# Patient Record
Sex: Female | Born: 1970 | Race: White | Hispanic: Yes | Marital: Married | Smoking: Never smoker
Health system: Southern US, Community
[De-identification: ages and names within clinical notes are randomized; demographics above are authoritative.]

## PROBLEM LIST (undated history)

## (undated) HISTORY — PX: TUBAL LIGATION: SHX77

---

## 2000-11-15 ENCOUNTER — Ambulatory Visit (HOSPITAL_COMMUNITY): Admission: RE | Admit: 2000-11-15 | Discharge: 2000-11-15 | Payer: Self-pay | Admitting: *Deleted

## 2000-11-29 ENCOUNTER — Encounter: Admission: RE | Admit: 2000-11-29 | Discharge: 2000-11-29 | Payer: Self-pay | Admitting: Obstetrics & Gynecology

## 2000-12-06 ENCOUNTER — Ambulatory Visit (HOSPITAL_COMMUNITY): Admission: RE | Admit: 2000-12-06 | Discharge: 2000-12-06 | Payer: Self-pay | Admitting: Obstetrics & Gynecology

## 2000-12-06 ENCOUNTER — Encounter: Admission: RE | Admit: 2000-12-06 | Discharge: 2000-12-06 | Payer: Self-pay | Admitting: Obstetrics & Gynecology

## 2000-12-13 ENCOUNTER — Encounter: Admission: RE | Admit: 2000-12-13 | Discharge: 2000-12-13 | Payer: Self-pay | Admitting: Obstetrics & Gynecology

## 2000-12-20 ENCOUNTER — Encounter: Admission: RE | Admit: 2000-12-20 | Discharge: 2000-12-20 | Payer: Self-pay | Admitting: Obstetrics & Gynecology

## 2000-12-22 ENCOUNTER — Encounter (HOSPITAL_COMMUNITY): Admission: AD | Admit: 2000-12-22 | Discharge: 2000-12-27 | Payer: Self-pay | Admitting: *Deleted

## 2000-12-27 ENCOUNTER — Encounter: Admission: RE | Admit: 2000-12-27 | Discharge: 2000-12-27 | Payer: Self-pay | Admitting: Obstetrics & Gynecology

## 2000-12-28 ENCOUNTER — Inpatient Hospital Stay (HOSPITAL_COMMUNITY): Admission: AD | Admit: 2000-12-28 | Discharge: 2000-12-31 | Payer: Self-pay | Admitting: Obstetrics & Gynecology

## 2002-10-15 ENCOUNTER — Ambulatory Visit (HOSPITAL_COMMUNITY): Admission: RE | Admit: 2002-10-15 | Discharge: 2002-10-15 | Payer: Self-pay | Admitting: *Deleted

## 2003-02-07 ENCOUNTER — Ambulatory Visit (HOSPITAL_COMMUNITY): Admission: RE | Admit: 2003-02-07 | Discharge: 2003-02-07 | Payer: Self-pay | Admitting: Obstetrics and Gynecology

## 2003-02-11 ENCOUNTER — Inpatient Hospital Stay (HOSPITAL_COMMUNITY): Admission: AD | Admit: 2003-02-11 | Discharge: 2003-02-11 | Payer: Self-pay | Admitting: *Deleted

## 2003-02-20 ENCOUNTER — Inpatient Hospital Stay (HOSPITAL_COMMUNITY): Admission: RE | Admit: 2003-02-20 | Discharge: 2003-02-20 | Payer: Self-pay | Admitting: *Deleted

## 2003-02-21 ENCOUNTER — Encounter (INDEPENDENT_AMBULATORY_CARE_PROVIDER_SITE_OTHER): Payer: Self-pay

## 2003-02-21 ENCOUNTER — Inpatient Hospital Stay (HOSPITAL_COMMUNITY): Admission: RE | Admit: 2003-02-21 | Discharge: 2003-02-24 | Payer: Self-pay | Admitting: *Deleted

## 2005-05-19 ENCOUNTER — Encounter (INDEPENDENT_AMBULATORY_CARE_PROVIDER_SITE_OTHER): Payer: Self-pay | Admitting: Family Medicine

## 2005-05-19 LAB — CONVERTED CEMR LAB

## 2005-11-22 ENCOUNTER — Ambulatory Visit: Payer: Self-pay | Admitting: Family Medicine

## 2005-11-24 ENCOUNTER — Ambulatory Visit: Payer: Self-pay | Admitting: *Deleted

## 2006-04-05 ENCOUNTER — Ambulatory Visit: Payer: Self-pay | Admitting: Internal Medicine

## 2006-05-02 ENCOUNTER — Ambulatory Visit: Payer: Self-pay | Admitting: Family Medicine

## 2006-07-11 ENCOUNTER — Ambulatory Visit: Payer: Self-pay | Admitting: Family Medicine

## 2006-09-28 ENCOUNTER — Ambulatory Visit (HOSPITAL_COMMUNITY): Admission: RE | Admit: 2006-09-28 | Discharge: 2006-09-28 | Payer: Self-pay | Admitting: Family Medicine

## 2006-09-29 ENCOUNTER — Ambulatory Visit (HOSPITAL_COMMUNITY): Admission: RE | Admit: 2006-09-29 | Discharge: 2006-09-29 | Payer: Self-pay | Admitting: Family Medicine

## 2006-10-13 ENCOUNTER — Inpatient Hospital Stay (HOSPITAL_COMMUNITY): Admission: AD | Admit: 2006-10-13 | Discharge: 2006-10-13 | Payer: Self-pay | Admitting: Family Medicine

## 2006-10-18 ENCOUNTER — Emergency Department (HOSPITAL_COMMUNITY): Admission: EM | Admit: 2006-10-18 | Discharge: 2006-10-18 | Payer: Self-pay | Admitting: Emergency Medicine

## 2006-11-29 ENCOUNTER — Encounter (INDEPENDENT_AMBULATORY_CARE_PROVIDER_SITE_OTHER): Payer: Self-pay | Admitting: Family Medicine

## 2006-12-06 ENCOUNTER — Encounter (INDEPENDENT_AMBULATORY_CARE_PROVIDER_SITE_OTHER): Payer: Self-pay | Admitting: *Deleted

## 2006-12-08 ENCOUNTER — Ambulatory Visit (HOSPITAL_COMMUNITY): Admission: RE | Admit: 2006-12-08 | Discharge: 2006-12-08 | Payer: Self-pay | Admitting: Family Medicine

## 2007-02-27 ENCOUNTER — Ambulatory Visit: Payer: Self-pay | Admitting: Obstetrics & Gynecology

## 2007-02-27 ENCOUNTER — Inpatient Hospital Stay (HOSPITAL_COMMUNITY): Admission: RE | Admit: 2007-02-27 | Discharge: 2007-03-01 | Payer: Self-pay | Admitting: Obstetrics & Gynecology

## 2008-02-18 ENCOUNTER — Encounter (INDEPENDENT_AMBULATORY_CARE_PROVIDER_SITE_OTHER): Payer: Self-pay | Admitting: Family Medicine

## 2008-02-18 ENCOUNTER — Ambulatory Visit: Payer: Self-pay | Admitting: Family Medicine

## 2008-02-18 DIAGNOSIS — H6120 Impacted cerumen, unspecified ear: Secondary | ICD-10-CM

## 2008-02-18 LAB — CONVERTED CEMR LAB
Bilirubin Urine: NEGATIVE
Blood in Urine, dipstick: NEGATIVE
Chlamydia, DNA Probe: NEGATIVE
GC Probe Amp, Genital: NEGATIVE
Glucose, Urine, Semiquant: NEGATIVE
Ketones, urine, test strip: NEGATIVE
Nitrite: NEGATIVE
Protein, U semiquant: NEGATIVE
Specific Gravity, Urine: 1.01
Urobilinogen, UA: 0.2
WBC Urine, dipstick: NEGATIVE
pH: 6

## 2008-03-03 ENCOUNTER — Ambulatory Visit: Payer: Self-pay | Admitting: Nurse Practitioner

## 2008-03-03 DIAGNOSIS — J019 Acute sinusitis, unspecified: Secondary | ICD-10-CM | POA: Insufficient documentation

## 2008-03-03 LAB — CONVERTED CEMR LAB: Rapid Strep: NEGATIVE

## 2008-03-16 ENCOUNTER — Encounter (INDEPENDENT_AMBULATORY_CARE_PROVIDER_SITE_OTHER): Payer: Self-pay | Admitting: Family Medicine

## 2009-02-05 ENCOUNTER — Ambulatory Visit: Payer: Self-pay | Admitting: Physician Assistant

## 2009-02-05 DIAGNOSIS — J069 Acute upper respiratory infection, unspecified: Secondary | ICD-10-CM

## 2009-02-05 LAB — CONVERTED CEMR LAB: Rapid Strep: NEGATIVE

## 2009-06-04 ENCOUNTER — Ambulatory Visit: Payer: Self-pay | Admitting: Physician Assistant

## 2009-06-04 DIAGNOSIS — R6889 Other general symptoms and signs: Secondary | ICD-10-CM

## 2009-06-04 LAB — CONVERTED CEMR LAB
ALT: 16 units/L (ref 0–35)
AST: 15 units/L (ref 0–37)
Albumin: 4 g/dL (ref 3.5–5.2)
Alkaline Phosphatase: 64 units/L (ref 39–117)
BUN: 10 mg/dL (ref 6–23)
Basophils Absolute: 0 10*3/uL (ref 0.0–0.1)
Basophils Relative: 0 % (ref 0–1)
Bilirubin Urine: NEGATIVE
CO2: 26 meq/L (ref 19–32)
Calcium: 8.7 mg/dL (ref 8.4–10.5)
Chlamydia, DNA Probe: NEGATIVE
Chloride: 103 meq/L (ref 96–112)
Creatinine, Ser: 0.79 mg/dL (ref 0.40–1.20)
Eosinophils Absolute: 0.1 10*3/uL (ref 0.0–0.7)
Eosinophils Relative: 2 % (ref 0–5)
GC Probe Amp, Genital: NEGATIVE
Glucose, Bld: 104 mg/dL — ABNORMAL HIGH (ref 70–99)
Glucose, Urine, Semiquant: NEGATIVE
HCT: 36.9 % (ref 36.0–46.0)
Hemoglobin: 11.4 g/dL — ABNORMAL LOW (ref 12.0–15.0)
KOH Prep: NEGATIVE
Ketones, urine, test strip: NEGATIVE
Lymphocytes Relative: 30 % (ref 12–46)
Lymphs Abs: 1.8 10*3/uL (ref 0.7–4.0)
MCHC: 30.9 g/dL (ref 30.0–36.0)
MCV: 80.9 fL (ref 78.0–100.0)
Monocytes Absolute: 0.4 10*3/uL (ref 0.1–1.0)
Monocytes Relative: 7 % (ref 3–12)
Neutro Abs: 3.5 10*3/uL (ref 1.7–7.7)
Neutrophils Relative %: 61 % (ref 43–77)
Nitrite: NEGATIVE
Pap Smear: NEGATIVE
Platelets: 192 10*3/uL (ref 150–400)
Potassium: 4.2 meq/L (ref 3.5–5.3)
Protein, U semiquant: NEGATIVE
RBC: 4.56 M/uL (ref 3.87–5.11)
RDW: 16 % — ABNORMAL HIGH (ref 11.5–15.5)
Sodium: 138 meq/L (ref 135–145)
Specific Gravity, Urine: >=1.03
TSH: 2.231 microintl units/mL (ref 0.350–4.500)
Total Bilirubin: 0.3 mg/dL (ref 0.3–1.2)
Total Protein: 6.8 g/dL (ref 6.0–8.3)
Urobilinogen, UA: 0.2
WBC Urine, dipstick: NEGATIVE
WBC: 5.8 10*3/uL (ref 4.0–10.5)
pH: 6

## 2009-06-09 ENCOUNTER — Telehealth (INDEPENDENT_AMBULATORY_CARE_PROVIDER_SITE_OTHER): Payer: Self-pay | Admitting: *Deleted

## 2009-06-09 ENCOUNTER — Encounter: Payer: Self-pay | Admitting: Physician Assistant

## 2009-06-10 DIAGNOSIS — D509 Iron deficiency anemia, unspecified: Secondary | ICD-10-CM | POA: Insufficient documentation

## 2009-06-10 LAB — CONVERTED CEMR LAB
Ferritin: 2 ng/mL — ABNORMAL LOW (ref 10–291)
Folate: 12.2 ng/mL
Iron: 44 ug/dL (ref 42–145)
Saturation Ratios: 10 % — ABNORMAL LOW (ref 20–55)
TIBC: 442 ug/dL (ref 250–470)
UIBC: 398 ug/dL
Vitamin B-12: 416 pg/mL (ref 211–911)

## 2009-06-26 ENCOUNTER — Ambulatory Visit: Payer: Self-pay | Admitting: Physician Assistant

## 2009-06-26 LAB — CONVERTED CEMR LAB
Blood Glucose, AC Bkfst: 89 mg/dL
Hgb A1c MFr Bld: 5.6 %

## 2009-09-14 ENCOUNTER — Ambulatory Visit: Payer: Self-pay | Admitting: Physician Assistant

## 2009-09-15 LAB — CONVERTED CEMR LAB
Basophils Absolute: 0 10*3/uL (ref 0.0–0.1)
Basophils Relative: 0 % (ref 0–1)
Eosinophils Absolute: 0.1 10*3/uL (ref 0.0–0.7)
Eosinophils Relative: 2 % (ref 0–5)
HCT: 35.1 % — ABNORMAL LOW (ref 36.0–46.0)
Hemoglobin: 11.2 g/dL — ABNORMAL LOW (ref 12.0–15.0)
Lymphocytes Relative: 27 % (ref 12–46)
Lymphs Abs: 2 10*3/uL (ref 0.7–4.0)
MCHC: 31.9 g/dL (ref 30.0–36.0)
MCV: 79.6 fL (ref 78.0–100.0)
Monocytes Absolute: 0.5 10*3/uL (ref 0.1–1.0)
Monocytes Relative: 6 % (ref 3–12)
Neutro Abs: 4.8 10*3/uL (ref 1.7–7.7)
Neutrophils Relative %: 65 % (ref 43–77)
Platelets: 208 10*3/uL (ref 150–400)
RBC: 4.41 M/uL (ref 3.87–5.11)
RDW: 15.7 % — ABNORMAL HIGH (ref 11.5–15.5)
WBC: 7.3 10*3/uL (ref 4.0–10.5)

## 2009-09-24 ENCOUNTER — Encounter (INDEPENDENT_AMBULATORY_CARE_PROVIDER_SITE_OTHER): Payer: Self-pay | Admitting: *Deleted

## 2009-12-18 ENCOUNTER — Encounter: Payer: Self-pay | Admitting: Physician Assistant

## 2009-12-18 ENCOUNTER — Ambulatory Visit: Payer: Self-pay | Admitting: Internal Medicine

## 2009-12-22 LAB — CONVERTED CEMR LAB
Basophils Absolute: 0 10*3/uL (ref 0.0–0.1)
Basophils Relative: 0 % (ref 0–1)
Eosinophils Absolute: 0.1 10*3/uL (ref 0.0–0.7)
Eosinophils Relative: 2 % (ref 0–5)
HCT: 37.5 % (ref 36.0–46.0)
Hemoglobin: 12 g/dL (ref 12.0–15.0)
Lymphocytes Relative: 33 % (ref 12–46)
Lymphs Abs: 2.2 10*3/uL (ref 0.7–4.0)
MCHC: 32 g/dL (ref 30.0–36.0)
MCV: 82.2 fL (ref 78.0–100.0)
Monocytes Absolute: 0.4 10*3/uL (ref 0.1–1.0)
Monocytes Relative: 5 % (ref 3–12)
Neutro Abs: 4 10*3/uL (ref 1.7–7.7)
Neutrophils Relative %: 59 % (ref 43–77)
Platelets: 192 10*3/uL (ref 150–400)
RBC: 4.56 M/uL (ref 3.87–5.11)
RDW: 14.8 % (ref 11.5–15.5)
WBC: 6.8 10*3/uL (ref 4.0–10.5)

## 2010-01-06 ENCOUNTER — Encounter (INDEPENDENT_AMBULATORY_CARE_PROVIDER_SITE_OTHER): Payer: Self-pay | Admitting: *Deleted

## 2010-03-08 ENCOUNTER — Encounter (INDEPENDENT_AMBULATORY_CARE_PROVIDER_SITE_OTHER): Payer: Self-pay | Admitting: Nurse Practitioner

## 2010-03-08 ENCOUNTER — Ambulatory Visit: Payer: Self-pay | Admitting: Physician Assistant

## 2010-03-09 LAB — CONVERTED CEMR LAB
Basophils Absolute: 0 10*3/uL (ref 0.0–0.1)
Basophils Relative: 0 % (ref 0–1)
Eosinophils Absolute: 0.1 10*3/uL (ref 0.0–0.7)
Eosinophils Relative: 2 % (ref 0–5)
HCT: 37.9 % (ref 36.0–46.0)
Hemoglobin: 12.1 g/dL (ref 12.0–15.0)
Lymphocytes Relative: 31 % (ref 12–46)
Lymphs Abs: 2.1 10*3/uL (ref 0.7–4.0)
MCHC: 31.9 g/dL (ref 30.0–36.0)
MCV: 84.8 fL (ref 78.0–100.0)
Monocytes Absolute: 0.4 10*3/uL (ref 0.1–1.0)
Monocytes Relative: 6 % (ref 3–12)
Neutro Abs: 4.1 10*3/uL (ref 1.7–7.7)
Neutrophils Relative %: 61 % (ref 43–77)
Platelets: 206 10*3/uL (ref 150–400)
RBC: 4.47 M/uL (ref 3.87–5.11)
RDW: 13.9 % (ref 11.5–15.5)
WBC: 6.8 10*3/uL (ref 4.0–10.5)

## 2010-04-10 ENCOUNTER — Encounter: Payer: Self-pay | Admitting: *Deleted

## 2010-04-20 NOTE — Letter (Signed)
Summary: *HSN Results Follow up  [facility]  [address] [place], [place] [address]   Phone: [phone]  Fax: [phone]      01/06/2010   Sharon Richardson [address] [place], [place]  [address]   Dear  [name],                            ____S.Drinkard,FNP   ____D. Gore,FNP       ____B. McPherson,MD   ____V. Rankins,MD    ____E. Mulberry,MD    ____N. Daphine Deutscher, FNP  ____D. Reche Dixon, MD    ____K. Philipp Deputy, MD    ____Other     This letter is to inform you that your recent test(s):  _______Pap Smear    ___X____Lab Test     _______X-ray    ___X____ is within acceptable limits  ___X____ requires a medication change  _______ requires a follow-up lab visit  _______ requires a follow-up visit with your provider   Comments: We have been trying to reach you about a medication change.  Please contact the office.       _________________________________________________________ If you have any questions, please contact our office                     Sincerely,  [name] [facility]

## 2010-04-20 NOTE — Progress Notes (Signed)
   Phone Note Outgoing Call   Summary of Call: Note she was fasting for labs. Get her to come in for fasting glucose and Hgb A1C in the next couple weeks.  Initial call taken by: Brynda Rim,  June 09, 2009 11:45 AM  Follow-up for Phone Call        gracelia please schedule this appt Follow-up by: [name],  June 09, 2009 12:04 PM  Additional Follow-up for Phone Call Additional follow up Details #1::        Appointment scheduled Pt will come for a lab on June 26, 2009 at 8:50 am.Graciela Kellar  June 11, 2009 9:34 AM

## 2010-04-20 NOTE — Letter (Signed)
Summary: *HSN Results Follow up  [facility]  [address] [place], [place] [address]   Phone: [phone]  Fax: [phone]      09/24/2009   Elanah ESQUIVEL [address] [place], [place]  [address]   Dear  Ms. Kristianna ESQUIVEL,                            ____S.Drinkard,FNP   ____D. Gore,FNP       ____B. McPherson,MD   ____V. Rankins,MD    ____E. Mulberry,MD    ____N. Daphine Deutscher, FNP  ____D. Reche Dixon, MD    ____K. Philipp Deputy, MD    ____Other     This letter is to inform you that your recent test(s):  _______Pap Smear    _______Lab Test     _______X-ray    _______ is within acceptable limits  _______ requires a medication change  _______ requires a follow-up lab visit  _______ requires a follow-up visit with your provider   Comments: We have been trying to reach you.  Please give the office a call at your earliest convenience.       _________________________________________________________ If you have any questions, please contact our office                     Sincerely,  [name] [facility]

## 2010-04-20 NOTE — Letter (Signed)
Summary: *HSN Results Follow up  [facility]  [address] [place], [place] [address]   Phone: [phone]  Fax: [phone]      06/09/2009   Kaizley ESQUIVEL [address] [place], [place]  [address]   Dear  Ms. Jazzalyn ESQUIVEL,                            ____S.Drinkard,FNP   ____D. Gore,FNP       ____B. McPherson,MD   ____V. Rankins,MD    ____E. Mulberry,MD    ____N. Daphine Deutscher, FNP  ____D. Reche Dixon, MD    ____K. Philipp Deputy, MD    __x__S. Alben Spittle, PA-C     This letter is to inform you that your recent test(s):  ___x____Pap Smear    _______Lab Test     _______X-ray    ___x____ is within acceptable limits  _______ requires a medication change  _______ requires a follow-up lab visit  _______ requires a follow-up visit with your provider   Comments:       _________________________________________________________ If you have any questions, please contact our office                     Sincerely,  Tereso Newcomer PA-C [facility]

## 2010-04-20 NOTE — Progress Notes (Signed)
Summary: Office Visit//DEPRESSION SCREENING  Office Visit//DEPRESSION SCREENING   Imported By: Arta Bruce 08/11/2009 14:23:55  _____________________________________________________________________  External Attachment:    Type:   Image     Comment:   External Document

## 2010-04-20 NOTE — Assessment & Plan Note (Signed)
Summary: CPP EXAM//GK   Vital Signs:  Patient profile:   40 year old female Height:      61 inches Weight:      174 pounds Temp:     98.0 degrees F oral Pulse rate:   69 / minute Pulse rhythm:   regular Resp:     18 per minute BP sitting:   120 / 76  (left arm) Cuff size:   regular  Vitals Entered By: [name] (June 04, 2009 11:22 AM)  History of Present Illness: Here for CPP. No h/o abnormal pap. No abnormal bleeding or discharge. No FHx of breast CA. Not taking calcium.  Problems Prior to Update: 1)  Other General Symptoms  (ICD-780.99) 2)  Uri  (ICD-465.9) 3)  Acute Sinusitis, Unspecified  (ICD-461.9) 4)  Cerumen Impaction, Bilateral  (ICD-380.4) 5)  Screening For Malignant Neoplasm, Cervix  (ICD-V76.2) 6)  Examination, Routine Medical  (ICD-V70.0)  Allergies: No Known Drug Allergies  Past History:  Past Medical History: Last updated: 02/18/2008 Unremarkable  Past Surgical History: Last updated: 02/18/2008 Caesarean sections x 2,2004,2008. Tubal ligation 2008  Family History: Reviewed history from 02/18/2008 and no changes required. Mother living DM Father living unknown  No Breast/cervical/uterine/ovarian/colon cancers.  Social History: Reviewed history from 02/18/2008 and no changes required. Married x 18+ years. G6P5 Never Smoked Alcohol use-no Drug use-no Occupation:works at [employer]  Review of Systems      See HPI General:  Denies chills, fatigue, and fever. Eyes:  Complains of vision loss-1 eye; h/o trauma to left eye. CV:  Denies chest pain or discomfort, fainting, and shortness of breath with exertion. Resp:  Denies cough. GI:  Complains of constipation; denies bloody stools, dark tarry stools, and diarrhea. GU:  Denies dysuria. MS:  Denies joint pain. Psych:  Denies depression. Endo:  Complains of cold intolerance.  Physical Exam  General:  alert, well-developed, and well-nourished.  alert, well-developed, and  well-nourished.   Head:  normocephalic and atraumatic.  normocephalic and atraumatic.   Eyes:  left pupil abnormally shaped without light reaction (h/o prior trauma to eye) right pupil reactive to light Ears:  R ear normal and L ear normal.  R ear normal and L ear normal.   Nose:  no external deformity.  no external deformity.   Mouth:  pharynx pink and moist, no erythema, and no exudates.  pharynx pink and moist, no erythema, and no exudates.   Neck:  supple and no cervical lymphadenopathy.  supple and no cervical lymphadenopathy.   Breasts:  skin/areolae normal, no masses, no abnormal thickening, no nipple discharge, no tenderness, and no adenopathy.  skin/areolae normal, no masses, no abnormal thickening, no nipple discharge, no tenderness, and no adenopathy.   Lungs:  normal breath sounds, no crackles, and no wheezes.  normal breath sounds, no crackles, and no wheezes.   Heart:  normal rate, regular rhythm, and no murmur.  normal rate, regular rhythm, and no murmur.   Abdomen:  soft, non-tender, normal bowel sounds, and no hepatomegaly.  soft, non-tender, normal bowel sounds, and no hepatomegaly.   Rectal:  no external abnormalities.  no external abnormalities.   Genitalia:  normal introitus, no external lesions, no vaginal discharge, mucosa pink and moist, no vaginal or cervical lesions, no vaginal atrophy, no friaility or hemorrhage, normal uterus size and position, and no adnexal masses or tenderness.  (exam of adnexae s/w limited by body habitus)normal introitus, no external lesions, no vaginal discharge, mucosa pink and moist, no vaginal or  cervical lesions, no vaginal atrophy, no friaility or hemorrhage, normal uterus size and position, and no adnexal masses or tenderness.   Msk:  normal ROM.  normal ROM.   Pulses:  DP?PT 2+ bilat Extremities:  multiple varicosities noted BLE Neurologic:  alert & oriented X3, cranial nerves II-XII intact, and strength normal in all extremities.  alert &  oriented X3, cranial nerves II-XII intact, and strength normal in all extremities.   Skin:  turgor normal.  turgor normal.   Psych:  normally interactive.  normally interactive.     Impression & Recommendations:  Problem # 1:  EXAMINATION, ROUTINE MEDICAL (ICD-V70.0) note trace intact blood in u/a patient noted to have bleeding start during pap (likely starting menses) had some cold intol and has a FHx of thyroid disease PHQ9=2  Orders: T-HIV Antibody  (Reflex) [phone]) T-Syphilis Test (RPR) [phone]) T-Pap Smear, Thin Prep (29562) T- GC Chlamydia ([address]) KOH/ WET Mount [phone]) T-Comprehensive Metabolic Panel [phone])  Problem # 2:  SCREENING FOR MALIGNANT NEOPLASM, CERVIX (ICD-V76.2)  Orders: T-Pap Smear, Thin Prep (24401)  Other Orders: T-CBC w/Diff ([id number]) T-TSH ([id number])  Patient Instructions: 1)  Td shot today. 2)  Please schedule a follow-up appointment in 1 year for CPP with Scott.   Laboratory Results   Urine Tests    Routine Urinalysis   Glucose: negative   (Normal Range: Negative) Bilirubin: negative   (Normal Range: Negative) Ketone: negative   (Normal Range: Negative) Spec. Gravity: >=1.030   (Normal Range: 1.003-1.035) Blood: trace-intact   (Normal Range: Negative) pH: 6.0   (Normal Range: 5.0-8.0) Protein: negative   (Normal Range: Negative) Urobilinogen: 0.2   (Normal Range: 0-1) Nitrite: negative   (Normal Range: Negative) Leukocyte Esterace: negative   (Normal Range: Negative)      Wet Mount Source: vaginal WBC/hpf: 1-5 Bacteria/hpf: rare Clue cells/hpf: none Yeast/hpf: none Wet Mount KOH: Negative Trichomonas/hpf: none

## 2010-06-09 ENCOUNTER — Encounter (INDEPENDENT_AMBULATORY_CARE_PROVIDER_SITE_OTHER): Payer: Self-pay | Admitting: Nurse Practitioner

## 2010-06-10 LAB — CONVERTED CEMR LAB
Basophils Absolute: 0 10*3/uL (ref 0.0–0.1)
Basophils Relative: 0 % (ref 0–1)
Eosinophils Absolute: 0.2 10*3/uL (ref 0.0–0.7)
Eosinophils Relative: 2 % (ref 0–5)
HCT: 37.4 % (ref 36.0–46.0)
Hemoglobin: 11.9 g/dL — ABNORMAL LOW (ref 12.0–15.0)
Lymphocytes Relative: 35 % (ref 12–46)
Lymphs Abs: 2.4 10*3/uL (ref 0.7–4.0)
MCHC: 31.8 g/dL (ref 30.0–36.0)
MCV: 80.8 fL (ref 78.0–100.0)
Monocytes Absolute: 0.6 10*3/uL (ref 0.1–1.0)
Monocytes Relative: 9 % (ref 3–12)
Neutro Abs: 3.7 10*3/uL (ref 1.7–7.7)
Neutrophils Relative %: 54 % (ref 43–77)
Platelets: 211 10*3/uL (ref 150–400)
RBC: 4.63 M/uL (ref 3.87–5.11)
RDW: 14.2 % (ref 11.5–15.5)
WBC: 6.9 10*3/uL (ref 4.0–10.5)

## 2010-08-03 NOTE — Op Note (Signed)
Sharon Richardson, Sharon Richardson           ACCOUNT NO.:  [id number]   MEDICAL RECORD NO.:  [id number]          PATIENT TYPE:  INP   LOCATION:  9110                          [facility]ILITY:  [facility]   PHYSICIAN:  Lesly Dukes, M.D. DATE OF BIRTH:  [birth year]   DATE OF PROCEDURE:  02/27/2007  DATE OF DISCHARGE:  03/01/2007                               OPERATIVE REPORT   PREOPERATIVE DIAGNOSIS:  A 40 year old who is para 4-0-1-4 at 5 plus  weeks estimated gestational age for repeat cesarean section and  bilateral tubal ligation.   POSTOPERATIVE DIAGNOSIS:  A 40 year old who is para 4-0-1-4 at 74 plus  weeks estimated gestational age for repeat cesarean section and  bilateral tubal ligation.   PROCEDURE:  Repeat cesarean section, bilateral tubal ligation.   SURGEON:  Lesly Dukes, M.D.   ANESTHESIA:  Spinal.   FINDINGS:  Viable female infant, Apgars 9 at one minute and 9 at five  minutes weighing 7 pounds 6 ounces, vertex, clear fluid.   PATHOLOGY:  None.   ESTIMATED BLOOD LOSS:  1000.   COMPLICATIONS:  None.   PROCEDURE:  After informed consent was obtained, the patient was taken  to the operating room, and spinal anesthesia was found to be adequate.  The patient was placed in the dorsal supine position with leftward tilt  and prepared and draped in a normal sterile fashion.  A Foley was placed  in the bladder.  A Pfannenstiel skin incision was made with a scalpel  and carried down to the fascia which was incised in the midline and  incision was extended bilaterally with Mayo scissors.  Superior and  inferior aspects of the fascial incision were grasped with Kocher  clamps, tented up, and dissected off sharply and bluntly from underlying  layers of rectus muscles.  Rectus muscles were separated in the midline.  Peritoneum was identified, tented up and entered sharply with the  Metzenbaum scissors.  This incision was extended both superiorly and  inferiorly with good visualization of  the bladder.  The bladder blade  was inserted.  The vesicouterine peritoneum was identified, tented up  and entered sharply with the Metzenbaum scissors.  This incision was  extended bilaterally, and the bladder flap was created digitally, and  the bladder blade was reinserted.  Uterine incision was cut in a  transverse fashion in a lower uterine segment and incision distended  bilaterally with bandage scissors.  Head delivered atraumatically.  The  nose and mouth were suctioned.  The baby's body delivered easily, cord  was clamped and cut, and the baby was handed to the waiting  pediatrician.  Placenta was delivered manually and had a three-vessel  cord.  Uterus was cleared of all clots and debris and was sewn shut with  0 Vicryl in a running locked fashion.  The uterus was noted to be  hemostatic off tension.  Each fallopian tube was then identified and  followed out to the fimbriated end, and a Filshie clip was placed across  each fallopian tube.  There was good hemostasis.  Again, there was good  hemostasis noted on the uterus.  Rectus  muscles in the peritoneum were  noted to be hemostatic.  The fascia was closed with 0 Vicryl in a  running fashion.  Subcutaneous tissue was copiously irrigated and noted  to be hemostatic.  The skin was closed with staples.  The patient  tolerated the procedure well.  Sponge, lap, instrument and needle count  were correct x2.  The patient went to recovery room in stable condition.      Lesly Dukes, M.D.  Electronically Signed     KHL/MEDQ  D:  03/26/2007  T:  03/26/2007  Job:  [id number]

## 2010-08-03 NOTE — Discharge Summary (Signed)
Sharon Richardson, TORRENS           ACCOUNT NO.:  [id number]   MEDICAL RECORD NO.:  [id number]          PATIENT TYPE:  INP   LOCATION:  9110                          [facility]ILITY:  [facility]   PHYSICIAN:  Lesly Dukes, M.D. DATE OF BIRTH:  March 07, 1971   DATE OF ADMISSION:  02/27/2007  DATE OF DISCHARGE:                               DISCHARGE SUMMARY   On February 27, 2007, this 40 year old G5, P5-0-0-5, was admitted for  onset of labor at 40 weeks and 6 days.  She was delivered by C-section.  The C-section was done because of a repeat.  The C-section was completed  by Dr. Penne Lash.  Please see her dictation of the operative report for  details.  A viable female was deliverd with Apgar's of 9 at one minute  and 9 at five minutes was delivered.  She was 7 pounds 6 ounces.  Estimated blood loss was 1,000 ml.  The patient is bottle feeding.  Following the C-section, she received a bilateral tubal ligation.  Following the C-section, the patient's hemoglobin dropped into the 9s  but it remained stable there.  She will be discharged home on iron.  The  patient is to follow up at the health department in 6 weeks and have 6  weeks of pelvic rest.   Prenatal labs were as follows:  The patient was A positive blood type,  antibody negative.  RPR negative.  Rubella immune.  Hepatitis surface  antigen negative.  HIV nonreactive.  GBS negative.  Discharge hemoglobin  was 9.2 and hematocrit was 27.2 on February 27, 2007.   She was discharged on the following medications:  1. Percocet 5/325, take one tab q.6 h. p.r.n. for pain.  2. Ibuprofen 600 mg, take one tab every 6 hours p.r.n. for mild pain.  3. Colace 100 mg, take one tab b.i.d. p.r.n. for constipation.  4. Iron take one tab b.i.d. for anemia.   Discharge Instructions:  1. No heavy lifting x6 weeks  2. Nothing per vagina x6 weeks  3. No driving while taking percocet   Discharge appointment  Pt to call [facility] for appointment      Asher Muir, MD      Lesly Dukes, M.D.  Electronically Signed    SO/MEDQ  D:  03/01/2007  T:  03/01/2007  Job:  [id number]

## 2010-08-06 NOTE — Discharge Summary (Signed)
NAME:  Sharon Richardson, Sharon Richardson              ACCOUNT NO.:  [id number]   MEDICAL RECORD NO.:  [id number]                   PATIENT TYPE:  INP   LOCATION:  9132                                 [facility]ILITY[facility]   PHYSICIAN:  Conni Elliot, M.D.             DATE OF BIRTH:  1970-11-01   DATE OF ADMISSION:  02/21/2003  DATE OF DISCHARGE:  02/24/2003                                 DISCHARGE SUMMARY   DISCHARGE DIAGNOSES:  Childbirth via cesarean section.   DISCHARGE MEDICATIONS:  1. Iron sulfate.  2. Prenatal vitamins.   DISPOSITION:  Discharged to home.   FOLLOWUP:  Six weeks at [facility].   PROCEDURE PERFORMED:  Low transverse cesarean section of a viable female  infant.   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   PHYSICAL EXAMINATION:  VITAL SIGNS:  On physical examination her temperature  is 97.2, pulse 85, respirations 16, blood pressure 115/70.  Fetal heart rate  140s.  This is noted to be nonreactive, but no decelerations.  HEENT:  Within normal limits.  NECK:  Supple.  LUNGS:  Clear to auscultation.  HEART:  Normal sinus rhythm.  BREAST:  Gravid.  ABDOMEN:  Gravid, soft, nontender, nondistended.   HOSPITAL COURSE:  Problem 1 - INTRAUTERINE PREGNANCY WITH BABY WITH  ACHONDROPLASIA FOR REPEAT CESAREAN SECTION:  Cesarean section was performed  without complications.  The patient remained afebrile.  Upon admission her  CBC was white count 6.3, H&H 12.0 and 36.5, platelets 200,000.  A urinalysis  showed 15 of ketones, but was otherwise negative.  RPR was nonreactive.  At  discharge the CBC was white count 6.7, H&H 7.6 and 22.8, platelets 147,000.  The patient was asymptomatic and not tachycardic with this low hemoglobin so  she was started on iron and was discharged home in recovering condition.   PROCEDURE:  Ultrasound.  No postpartum procedures.   COMPLICATIONS:   None with a term pregnancy delivered by cesarean section.   ACTIVITY:  No heavy lifting for two weeks.  Vaginal rest for six weeks.   DIET:  Regular.   MEDICATIONS:  1. Prenatal vitamins.  2. Iron.   CONDITION ON DISCHARGE:  She is well.   DISCHARGE INSTRUCTIONS:  Has routine instructions.  Discharged to home with  follow-up in six weeks at [facility].     Ursula Beath, MD                     Conni Elliot, M.D.    JT/MEDQ  D:  02/24/2003  T:  02/24/2003  Job:  [id number]   cc:   [facility]

## 2010-08-06 NOTE — Op Note (Signed)
NAME:  Sharon Richardson, Sharon Richardson              ACCOUNT NO.:  [id number]   MEDICAL RECORD NO.:  [id number]                   PATIENT TYPE:  INP   LOCATION:  9132                                 [facility]ILITY[facility]   PHYSICIAN:  Conni Elliot, M.D.             DATE OF BIRTH:  06-24-1970   DATE OF PROCEDURE:  02/21/2003  DATE OF DISCHARGE:  02/24/2003                                 OPERATIVE REPORT   PREOPERATIVE DIAGNOSIS:  Fetal achondroplasia.   POSTOPERATIVE DIAGNOSIS:  Fetal achondroplasia.   OPERATION:  Low transverse cesarean.   OPERATOR:  Conni Elliot, M.D.   ANESTHESIA:  Spinal.   OPERATIVE FINDINGS:  An 8 pound 0 ounce female with Apgars of 7 and 8, cord  pH of 7.42.   OPERATIVE PROCEDURE:  After placing the patient under a spinal anesthetic,  the patient supine in a left lateral tilt position, the abdomen was prepped  and draped in a sterile fashion.  A low transverse Pfannenstiel incision was  made.  An incision was made in the skin and subcutaneous, fascia, rectus  muscles separated, and the peritoneum entered and a bladder flap created.  A  low transverse uterine incision was made.  The baby was delivered from the  vertex presentation, cord doubly clamped and cut, and the baby handed to the  neonatologist in attendance.  The placenta delivered spontaneously.  The  uterus, bladder flap, anterior peritoneum, fascia, subcutaneous, and skin  were closed in the usual fashion.  Estimated blood loss was less than 800  mL.  Sponge, needle, and instrument counts were correct.                                               Conni Elliot, M.D.    ASG/MEDQ  D:  05/08/2003  T:  05/08/2003  Job:  [id number]

## 2010-12-27 LAB — CBC
HCT: 27.2 — ABNORMAL LOW
HCT: 27.5 — ABNORMAL LOW
HCT: 27.9 — ABNORMAL LOW
HCT: 36.8
Hemoglobin: 12.2
Hemoglobin: 9.2 — ABNORMAL LOW
Hemoglobin: 9.3 — ABNORMAL LOW
Hemoglobin: 9.5 — ABNORMAL LOW
MCHC: 33.3
MCHC: 33.7
MCHC: 33.9
MCHC: 34
MCV: 82
MCV: 82.1
MCV: 82.3
MCV: 82.4
Platelets: 126 — ABNORMAL LOW
Platelets: 129 — ABNORMAL LOW
Platelets: 130 — ABNORMAL LOW
Platelets: 157
RBC: 3.31 — ABNORMAL LOW
RBC: 3.34 — ABNORMAL LOW
RBC: 3.4 — ABNORMAL LOW
RBC: 4.49
RDW: 16.9 — ABNORMAL HIGH
RDW: 16.9 — ABNORMAL HIGH
RDW: 16.9 — ABNORMAL HIGH
RDW: 17.2 — ABNORMAL HIGH
WBC: 5.9
WBC: 6
WBC: 6.4
WBC: 6.9

## 2010-12-27 LAB — RPR: RPR Ser Ql: NONREACTIVE

## 2013-09-24 ENCOUNTER — Other Ambulatory Visit (HOSPITAL_COMMUNITY)
Admission: RE | Admit: 2013-09-24 | Discharge: 2013-09-24 | Disposition: A | Discharge status: Home / Self Care | Payer: No Typology Code available for payment source | Source: Ambulatory Visit | Attending: Internal Medicine | Admitting: Internal Medicine

## 2013-09-24 ENCOUNTER — Ambulatory Visit: Payer: No Typology Code available for payment source | Attending: Internal Medicine | Admitting: Internal Medicine

## 2013-09-24 ENCOUNTER — Encounter: Payer: Self-pay | Admitting: Internal Medicine

## 2013-09-24 VITALS — BP 109/72 | HR 76 | Temp 98.0°F | Resp 14 | Ht 60.0 in | Wt 157.6 lb

## 2013-09-24 DIAGNOSIS — Z124 Encounter for screening for malignant neoplasm of cervix: Secondary | ICD-10-CM

## 2013-09-24 DIAGNOSIS — Z01419 Encounter for gynecological examination (general) (routine) without abnormal findings: Secondary | ICD-10-CM | POA: Insufficient documentation

## 2013-09-24 DIAGNOSIS — Z1151 Encounter for screening for human papillomavirus (HPV): Secondary | ICD-10-CM | POA: Insufficient documentation

## 2013-09-24 DIAGNOSIS — Z0000 Encounter for general adult medical examination without abnormal findings: Secondary | ICD-10-CM | POA: Insufficient documentation

## 2013-09-24 DIAGNOSIS — N760 Acute vaginitis: Secondary | ICD-10-CM | POA: Insufficient documentation

## 2013-09-24 DIAGNOSIS — Z23 Encounter for immunization: Secondary | ICD-10-CM

## 2013-09-24 DIAGNOSIS — Z113 Encounter for screening for infections with a predominantly sexual mode of transmission: Secondary | ICD-10-CM | POA: Insufficient documentation

## 2013-09-24 LAB — POCT URINALYSIS DIPSTICK
Bilirubin, UA: NEGATIVE
Blood, UA: NEGATIVE
Glucose, UA: NEGATIVE
Ketones, UA: NEGATIVE
Leukocytes, UA: NEGATIVE
Nitrite, UA: NEGATIVE
Protein, UA: NEGATIVE
Spec Grav, UA: 1.02
Urobilinogen, UA: 0.2
pH, UA: 6.5

## 2013-09-24 NOTE — Patient Instructions (Signed)
Plan de alimentacin DASH (DASH Eating Plan) DASH es la sigla en ingls de "Enfoques alimentarios para bajar la hipertensin". El plan de alimentacin DASH ha demostrado bajar la presin arterial elevada (hipertensin). Los beneficios adicionales para la salud pueden incluir la disminucin del riesgo de diabetes mellitus tipo2, enfermedades cardacas e ictus. Este plan tambin puede ayudar a adelgazar. QU DEBO SABER ACERCA DEL PLAN DE ALIMENTACIN DASH? Para el plan de alimentacin DASH, seguir las siguientes pautas generales:  Elija los alimentos con un valor porcentual diario de sodio de menos del 5% (segn figura en la etiqueta del alimento).  Use hierbas o aderezos sin sal, en lugar de sal de mesa o sal marina.  Consulte al mdico o farmacutico antes de usar sustitutos de la sal.  Coma productos con bajo contenido de sodio, cuya etiqueta suele decir "bajo contenido de sodio" o "sin agregado de sal".  Coma alimentos frescos.  Coma ms verduras, frutas y productos lcteos con bajo contenido de grasas.  Elija los cereales integrales. Busque el trmino "integrales" como la segunda palabra en la lista de ingredientes.  Elija el pescado y el pollo o el pavo sin piel ms a menudo que las carnes rojas. Limite el consumo de pescado, carne de ave y carne a 6onzas (170g) por da.  Limite el consumo de dulces, postres, azcares y bebidas azucaradas.  Elija las grasas saludables para el corazn.  Limite el consumo de queso a 1oz (28g) por da.  Consuma ms alimentos caseros y menos comida rpida, de bufs o de restaurantes.  Limite el consumo de alimentos fritos.  Cocine los alimentos con otros mtodos que no sean la fritura.  Limite las verduras enlatadas. Si las consume, enjuguelas bien para disminuir el sodio.  Cuando coma en un restaurante, pida que preparen su comida con menos sal o, en lo posible, sin nada de sal. QU ALIMENTOS PUEDO COMER? Pida ayuda a un nutricionista  para conocer las necesidades calricas individuales. Cereales Pan de salvado o integral. Arroz integral. Pastas de salvado o integrales. Quinua, trigo burgol y cereales integrales. Cereales con bajo contenido de sodio. Tortillas de harina de maz o de salvado. Pan de maz integral. Galletas saladas integrales. Galletas con bajo contenido de sodio. Vegetales Verduras frescas o congeladas (crudas, al vapor, asadas o grilladas). Jugos de tomate y verduras con contenido bajo o reducido de sodio. Pasta y salsa de tomate con contenido bajo o reducido de sodio. Verduras enlatadas con bajo contenido de sodio o reducido de sodio.  Frutas Frutas frescas, en conserva (en su jugo natural) o frutas congeladas. Carnes y otras fuentes de protenas Carne de res molida (al 85% o ms magra), carne de res de animales alimentados con pastos o carne de res sin la grasa. Pollo o pavo sin piel. Carne de pollo o de pavo molida. Cerdo sin la grasa. Todos los pescados y frutos de mar. Huevos. Porotos, guisantes o lentejas secos. Frutos secos y semillas sin sal. Frijoles enlatados sin sal. Lcteos Productos lcteos con bajo contenido de grasas, como leche descremada o al 1%, quesos reducidos en grasas o al 2%, ricota con bajo contenido de grasas o queso cottage, o yogur natural con bajo contenido de grasas. Quesos con contenido bajo o reducido de sodio. Grasas y aceites Margarinas en barra que no contengan grasas trans. Mayonesa y alios para ensaladas livianos o reducidos en grasas (reducidos en sodio). [place]. Aceites de crtamo, oliva o canola. Mantequilla natural de man o almendra. Otros Palomitas de maz y pretzels sin   sal. Esta no es una lista completa de los alimentos o las bebidas recomendados. Consulte a su nutricionista para conocer ms opciones. QU ALIMENTOS NO ESTN RECOMENDADOS? Cereales Pan blanco. Pastas blancas. Arroz blanco. Pan de maz refinado. Bagels y croissants. Galletas saladas que contengan  grasas trans. Vegetales Vegetales con crema o fritos. Verduras en salsa de queso. Verduras enlatadas comunes. Pasta y salsa de tomate en lata comunes. Jugos comunes de tomate y de verduras. Frutas Frutas secas. Fruta enlatada en almbar liviano o espeso. Jugo de frutas. Carnes y otras fuentes de protenas [place] de carne con grasa. Costillas, alas de pollo, tocineta, salchicha, mortadela, salame, chinchulines, tocino, perros calientes, salchichas alemanas y embutidos envasados. Frutos secos y semillas con sal. Frijoles con sal en lata. Lcteos Leche entera o al 2%, crema, mezcla de leche y crema y queso crema. Yogur entero o endulzado. Quesos o queso azul con alto contenido de grasas. Cremas y coberturas batidas no lcteas. Quesos procesados, quesos para untar o cuajadas. Condimentos Sal de cebolla y ajo, sal condimentada, sal de mesa y sal marina. Salsas en lata y envasadas. Salsa Worcestershire. Salsa trtara. Salsa barbacoa. Salsa teriyaki. Salsa de soja, incluso la que tiene contenido reducido de sodio. Salsa de carne. Salsa de pescado. Salsa de ostras. Salsa rosada. Rbanos picantes. Ketchup y mostaza. Saborizantes y tiernizantes para carne. Caldo en cubitos. Salsa picante. Salsa tabasco. Adobos. Aderezos para tacos. Salsas. Grasas y aceites Mantequilla, margarina en barra, manteca de cerdo, grasa, mantequilla clarificada y grasa de tocineta. Aceites de coco, de palmiste o de palma. Aderezos comunes para ensalada. Otros Pickles y aceitunas. Palomitas de maz y pretzels con sal. Esta no es una lista completa de los alimentos y las bebidas que debe evitar. Consulte a su nutricionista para obtener ms informacin. DNDE PUEDO ENCONTRAR MS INFORMACIN? [facility] (National Heart, Lung, and Blood Institute): www.nhlbi.nih.gov/health/health-topics/topics/dash/ Document Released: 02/24/2011 Document Revised: 03/12/2013 Exit[employer] Patient Information 2015  [employer]. This information is not intended to replace advice given to you by your health care provider. Make sure you discuss any questions you have with your health care provider.  

## 2013-09-24 NOTE — Progress Notes (Signed)
Patient presents to establish care Requesting pap and breast exam States last exam was 3 years ago and was WNL at that time.

## 2013-09-24 NOTE — Progress Notes (Signed)
Patient ID: Sharon Richardson, female   DOB: [birth year], 43 y.o.   MRN: [id number]  [id number]  [id number]  DOB - [birth year]  CC:  Chief Complaint  Patient presents with  . Establish Care  . Gynecologic Exam       HPI: Sharon Richardson is a 43 y.o. female here today to establish medical care. She presents today for an annual physical and wellness exam. She reports her last menstrual period as June 24-25th. She states that she has a regular menses monthly. The patient reports her last pap exam was normal and 3 years ago, she denies ever having a mammogram. Past medical history per patient includes anemia which was treated with iron. Her surgical history is positive for a tubal ligation. She states she does not use condoms, and has a monogamous relationship. She denies any drugs, alcohol, tobacco, or allergies. Her family history is positive for diabetes in her mother and hypertension in her father.  No Known Allergies History reviewed. No pertinent past medical history. No current outpatient prescriptions on file prior to visit.   No current facility-administered medications on file prior to visit.   Family History  Problem Relation Age of Onset  . Diabetes Mother   . Hypertension Father    History   Social History  . Marital Status: Married    Spouse Name: N/A    Number of Children: N/A  . Years of Education: N/A   Occupational History  . Not on file.   Social History Main Topics  . Smoking status: Never Smoker   . Smokeless tobacco: Not on file  . Alcohol Use: Not on file  . Drug Use: Not on file  . Sexual Activity: Not on file   Other Topics Concern  . Not on file   Social History Narrative  . No narrative on file   Review of Systems  All other systems reviewed and are negative.     Objective:   Filed Vitals:   09/24/13 1548  BP: 109/72  Pulse: 76  Temp: 98 F (36.7 C)  Resp: 14   Physical Exam  Constitutional: She is oriented to person,  place, and time. She appears well-developed and well-nourished.  HENT:  Head: Normocephalic.  Right Ear: External ear normal.  Left Ear: External ear normal.  Nose: Nose normal.  Mouth/Throat: Uvula is midline and oropharynx is clear and moist.  Eyes: Conjunctivae and EOM are normal.    Bilateral pupil reactive to light  Neck: Normal range of motion. Neck supple.  Cardiovascular: Normal rate, regular rhythm, normal heart sounds and intact distal pulses.  Exam reveals no gallop and no friction rub.   No murmur heard. Pulmonary/Chest: Effort normal and breath sounds normal. No respiratory distress. She has no wheezes. She has no rales. Right breast exhibits no inverted nipple, no mass, no nipple discharge, no skin change and no tenderness. Left breast exhibits no inverted nipple, no mass, no nipple discharge, no skin change and no tenderness.  Abdominal: Soft. Bowel sounds are normal. She exhibits no distension. There is no tenderness.  Genitourinary: Vagina normal and uterus normal. Cervix exhibits no motion tenderness, no discharge and no friability. Right adnexum displays no tenderness. Left adnexum displays no tenderness. Vaginal discharge: thin clear, no odor.  Musculoskeletal: Normal range of motion.  Lymphadenopathy:       Head (right side): No submental, no submandibular, no tonsillar, no preauricular, no posterior auricular and no occipital adenopathy present.       Head (left  side): No submental, no submandibular, no tonsillar, no preauricular, no posterior auricular and no occipital adenopathy present.       Right cervical: No superficial cervical, no deep cervical and no posterior cervical adenopathy present.      Left cervical: No superficial cervical, no deep cervical and no posterior cervical adenopathy present.  Neurological: She is alert and oriented to person, place, and time. She has normal reflexes. No cranial nerve deficit.  Skin: Skin is warm, dry and intact. No cyanosis.  Nails show no clubbing.  Psychiatric: She has a normal mood and affect. Her speech is normal and behavior is normal. Judgment normal.     Lab Results  Component Value Date   WBC 6.9 06/09/2010   HGB 11.9* 06/09/2010   HCT 37.4 06/09/2010   MCV 80.8 06/09/2010   PLT 211 06/09/2010   Lab Results  Component Value Date   CREATININE 0.79 06/04/2009   BUN 10 06/04/2009   NA 138 06/04/2009   K 4.2 06/04/2009   CL 103 06/04/2009   CO2 26 06/04/2009    Lab Results  Component Value Date   HGBA1C 5.6 06/26/2009   Lipid Panel  No results found for this basename: chol, trig, hdl, cholhdl, vldl, ldlcalc       Assessment and plan:   Sharon Richardson was seen today for establish care and gynecologic exam.  Diagnoses and associated orders for this visit:  Annual physical exam - POCT urinalysis dipstick - Lipid panel; Future - Hemoglobin A1c; Future - CBC; Future - COMPLETE METABOLIC PANEL WITH GFR; Future - TSH; Future - MM Digital Screening; Future - Tdap vaccine greater than or equal to 7yo IM  Papanicolaou smear - Cytology - PAP [place] - Cervicovaginal ancillary only - HIV antibody (with reflex); Future - RPR; Future   Return in about 1 week (around 10/01/2013) for Lab Visit, 1 year physical.   Holland CommonsKECK, Khyrie Masi, NP-C [facility] [phone] 09/24/2013, 3:59 PM

## 2013-09-26 LAB — CYTOLOGY - PAP

## 2013-10-01 ENCOUNTER — Ambulatory Visit: Payer: No Typology Code available for payment source | Attending: Internal Medicine

## 2013-10-01 DIAGNOSIS — Z124 Encounter for screening for malignant neoplasm of cervix: Secondary | ICD-10-CM

## 2013-10-01 DIAGNOSIS — Z0000 Encounter for general adult medical examination without abnormal findings: Secondary | ICD-10-CM

## 2013-10-01 LAB — TSH: TSH: 3.172 u[IU]/mL (ref 0.350–4.500)

## 2013-10-01 LAB — CBC
HCT: 40.9 % (ref 36.0–46.0)
Hemoglobin: 13.6 g/dL (ref 12.0–15.0)
MCH: 28.1 pg (ref 26.0–34.0)
MCHC: 33.3 g/dL (ref 30.0–36.0)
MCV: 84.5 fL (ref 78.0–100.0)
PLATELETS: 200 10*3/uL (ref 150–400)
RBC: 4.84 MIL/uL (ref 3.87–5.11)
RDW: 13.9 % (ref 11.5–15.5)
WBC: 5.6 10*3/uL (ref 4.0–10.5)

## 2013-10-01 LAB — COMPLETE METABOLIC PANEL WITH GFR
ALBUMIN: 4.3 g/dL (ref 3.5–5.2)
ALT: 17 U/L (ref 0–35)
AST: 15 U/L (ref 0–37)
Alkaline Phosphatase: 65 U/L (ref 39–117)
BILIRUBIN TOTAL: 0.5 mg/dL (ref 0.2–1.2)
BUN: 13 mg/dL (ref 6–23)
CO2: 27 meq/L (ref 19–32)
Calcium: 9.3 mg/dL (ref 8.4–10.5)
Chloride: 105 mEq/L (ref 96–112)
Creat: 0.64 mg/dL (ref 0.50–1.10)
GFR, Est African American: >89 mL/min
GFR, Est Non African American: >89 mL/min
Glucose, Bld: 95 mg/dL (ref 70–99)
POTASSIUM: 4.4 meq/L (ref 3.5–5.3)
Sodium: 138 mEq/L (ref 135–145)
Total Protein: 7.1 g/dL (ref 6.0–8.3)

## 2013-10-01 LAB — LIPID PANEL
CHOL/HDL RATIO: 3.2 ratio
CHOLESTEROL: 184 mg/dL (ref 0–200)
HDL: 58 mg/dL (ref >39)
LDL Cholesterol: 99 mg/dL (ref 0–99)
Triglycerides: 136 mg/dL (ref <150)
VLDL: 27 mg/dL (ref 0–40)

## 2013-10-01 LAB — RPR

## 2013-10-01 LAB — HEMOGLOBIN A1C
Hgb A1c MFr Bld: 5.6 % (ref <5.7)
Mean Plasma Glucose: 114 mg/dL (ref <117)

## 2013-10-02 LAB — HIV ANTIBODY (ROUTINE TESTING W REFLEX): HIV 1&2 Ab, 4th Generation: NONREACTIVE

## 2013-10-04 ENCOUNTER — Telehealth: Payer: Self-pay

## 2013-10-04 ENCOUNTER — Ambulatory Visit
Admission: RE | Admit: 2013-10-04 | Discharge: 2013-10-04 | Disposition: A | Discharge status: Home / Self Care | Payer: PRIVATE HEALTH INSURANCE | Source: Ambulatory Visit | Attending: Internal Medicine | Admitting: Internal Medicine

## 2013-10-04 DIAGNOSIS — Z0000 Encounter for general adult medical examination without abnormal findings: Secondary | ICD-10-CM

## 2013-10-04 NOTE — Telephone Encounter (Signed)
Interpreter line used Spoke with patient husband  And he is aware Of his wife's lab results

## 2013-10-04 NOTE — Telephone Encounter (Signed)
Message copied by Lestine MountJUAREZ, Emmalie Haigh L on Fri Oct 04, 2013  8:56 AM ------      Message from: Holland CommonsKECK, VALERIE A      Created: Wed Oct 02, 2013  7:03 PM       Labs are normal ------

## 2013-10-07 ENCOUNTER — Other Ambulatory Visit: Payer: Self-pay | Admitting: Internal Medicine

## 2013-10-07 DIAGNOSIS — R928 Other abnormal and inconclusive findings on diagnostic imaging of breast: Secondary | ICD-10-CM

## 2013-10-16 ENCOUNTER — Ambulatory Visit
Admission: RE | Admit: 2013-10-16 | Discharge: 2013-10-16 | Disposition: A | Discharge status: Home / Self Care | Payer: PRIVATE HEALTH INSURANCE | Source: Ambulatory Visit | Attending: Internal Medicine | Admitting: Internal Medicine

## 2013-10-16 DIAGNOSIS — R928 Other abnormal and inconclusive findings on diagnostic imaging of breast: Secondary | ICD-10-CM

## 2013-11-05 ENCOUNTER — Telehealth: Payer: Self-pay | Admitting: *Deleted

## 2013-11-05 NOTE — Telephone Encounter (Signed)
Message copied by Fredderick SeveranceUCATTE, Monti Jilek L on Tue Nov 05, 2013  4:35 PM ------      Message from: Holland CommonsKECK, VALERIE A      Created: Fri Oct 18, 2013 10:16 AM       Mammogram normal repeat in one year ------

## 2013-11-05 NOTE — Telephone Encounter (Signed)
Patient notified via [employer] Interpreter of normal lab results as well as normal mammogram. Patient aware that she will need to repeat mammogram in one year

## 2014-12-19 ENCOUNTER — Ambulatory Visit: Payer: PRIVATE HEALTH INSURANCE | Attending: Internal Medicine

## 2014-12-25 ENCOUNTER — Ambulatory Visit: Payer: PRIVATE HEALTH INSURANCE | Attending: Internal Medicine | Admitting: Internal Medicine

## 2014-12-25 ENCOUNTER — Encounter: Payer: Self-pay | Admitting: Internal Medicine

## 2014-12-25 VITALS — BP 128/84 | HR 69 | Temp 98.3°F | Resp 16 | Wt 157.2 lb

## 2014-12-25 DIAGNOSIS — Z23 Encounter for immunization: Secondary | ICD-10-CM

## 2014-12-25 DIAGNOSIS — Z0000 Encounter for general adult medical examination without abnormal findings: Secondary | ICD-10-CM

## 2014-12-25 NOTE — Progress Notes (Signed)
Patient here for annual physical exam Patient received flu shot today

## 2014-12-25 NOTE — Progress Notes (Signed)
Patient ID: Sharon Richardson, female   DOB: 1970/10/24, 44 y.o.   MRN: [id number]  CC: annual physical    HPI: Sharon Richardson is a 44 y.o. female here today for a follow up visit.  Patient has past medical history of anemia. Patient presents requesting a annual physical. She is up to date on her pap.  Patient states that she was sent a letter from [facility] stating that it is time for a repeat mammogram, she would like order placed. She reports no new surgeries or medications. She no longer takes her ferrous sulfate because she felt better. Patient does not work. She states she does not use condoms, and has a monogamous relationship.    No Known Allergies History reviewed. No pertinent past medical history. Current Outpatient Prescriptions on File Prior to Visit  Medication Sig Dispense Refill  . ferrous sulfate 325 (65 FE) MG tablet Take 325 mg by mouth daily with breakfast.     No current facility-administered medications on file prior to visit.   Family History  Problem Relation Age of Onset  . Diabetes Mother   . Hypertension Father    Social History   Social History  . Marital Status: Married    Spouse Name: N/A  . Number of Children: N/A  . Years of Education: N/A   Occupational History  . Not on file.   Social History Main Topics  . Smoking status: Never Smoker   . Smokeless tobacco: Never Used  . Alcohol Use: No  . Drug Use: No  . Sexual Activity: Not on file   Other Topics Concern  . Not on file   Social History Narrative    Review of Systems: Constitutional: Negative for fever, chills, diaphoresis, activity change, appetite change and fatigue. HENT: Negative for ear pain, nosebleeds, congestion, facial swelling, rhinorrhea, neck pain, neck stiffness and ear discharge.  Eyes: Negative for pain, discharge, redness, itching and visual disturbance. Respiratory: Negative for cough, choking, chest tightness, shortness of breath,  wheezing and stridor.  Cardiovascular: Negative for chest pain, palpitations and leg swelling. Gastrointestinal: Negative for abdominal distention. Genitourinary: Negative for dysuria, urgency, frequency, hematuria, flank pain, decreased urine volume, difficulty urinating and dyspareunia.  Musculoskeletal: Negative for back pain, joint swelling, arthralgias and gait problem. Neurological: Negative for dizziness, tremors, seizures, syncope, facial asymmetry, speech difficulty, weakness, light-headedness, numbness and headaches.  Hematological: Negative for adenopathy. Does not bruise/bleed easily. Psychiatric/Behavioral: Negative for hallucinations, behavioral problems, confusion, dysphoric mood, decreased concentration and agitation.    Objective:   Filed Vitals:   12/25/14 1533  BP: 128/84  Pulse: 69  Temp: 98.3 F (36.8 C)  Resp: 16    Physical Exam: Constitutional: Patient appears well-developed and well-nourished. No distress. HENT: Normocephalic, atraumatic, External right and left ear normal. Oropharynx is clear and moist.  Eyes: Conjunctivae and EOM are normal. PERRLA, no scleral icterus. Neck: Normal ROM. Neck supple. No JVD. No tracheal deviation. No thyromegaly. CVS: RRR, S1/S2 +, no murmurs, no gallops, no carotid bruit.  Pulmonary: Effort and breath sounds normal, no stridor, rhonchi, wheezes, rales.  Abdominal: Soft. BS +,  no distension, tenderness, rebound or guarding.  Musculoskeletal: Normal range of motion. No edema and no tenderness.  Lymphadenopathy: No lymphadenopathy noted, cervical, inguinal or axillary Neuro: Alert. Normal reflexes, muscle tone coordination. No cranial nerve deficit. Skin: Skin is warm and dry. No rash noted. Not diaphoretic. No erythema. No pallor. Psychiatric: Normal mood and affect. Behavior, judgment, thought content normal.  Lab Results  Component Value Date   WBC 5.6 10/01/2013   HGB 13.6 10/01/2013   HCT 40.9 10/01/2013   MCV  84.5 10/01/2013   PLT 200 10/01/2013   Lab Results  Component Value Date   CREATININE 0.64 10/01/2013   BUN 13 10/01/2013   NA 138 10/01/2013   K 4.4 10/01/2013   CL 105 10/01/2013   CO2 27 10/01/2013    Lab Results  Component Value Date   HGBA1C 5.6 10/01/2013   Lipid Panel     Component Value Date/Time   CHOL 184 10/01/2013 0858   TRIG 136 10/01/2013 0858   HDL 58 10/01/2013 0858   CHOLHDL 3.2 10/01/2013 0858   VLDL 27 10/01/2013 0858   LDLCALC 99 10/01/2013 0858       Assessment and plan:   [name] was seen today for annual exam.  Diagnoses and all orders for this visit:  Annual physical exam -     Lipid panel; Future -     CBC; Future -     COMPLETE METABOLIC PANEL WITH GFR; Future -     Vitamin D, 25-hydroxy; Future  Need for influenza vaccination -     Flu Vaccine QUAD 36+ mos PF IM (Fluarix & Fluzone Quad PF)   Due to language barrier, an interpreter was present during the history-taking and subsequent discussion (and for part of the physical exam) with this patient.  Return in about 1 day (around 12/26/2014) for Lab Visit.        Ambrose Finland, NP-C [facility] [phone] 12/25/2014, 3:42 PM

## 2014-12-26 ENCOUNTER — Ambulatory Visit: Payer: PRIVATE HEALTH INSURANCE | Attending: Internal Medicine

## 2014-12-26 DIAGNOSIS — Z0000 Encounter for general adult medical examination without abnormal findings: Secondary | ICD-10-CM

## 2014-12-26 LAB — CBC
HCT: 38.4 % (ref 36.0–46.0)
Hemoglobin: 12.5 g/dL (ref 12.0–15.0)
MCH: 26.7 pg (ref 26.0–34.0)
MCHC: 32.6 g/dL (ref 30.0–36.0)
MCV: 82.1 fL (ref 78.0–100.0)
MPV: 11.9 fL (ref 8.6–12.4)
Platelets: 184 10*3/uL (ref 150–400)
RBC: 4.68 MIL/uL (ref 3.87–5.11)
RDW: 14.7 % (ref 11.5–15.5)
WBC: 5.2 10*3/uL (ref 4.0–10.5)

## 2014-12-26 LAB — COMPLETE METABOLIC PANEL WITH GFR
ALK PHOS: 60 U/L (ref 33–115)
ALT: 13 U/L (ref 6–29)
AST: 15 U/L (ref 10–30)
Albumin: 4.1 g/dL (ref 3.6–5.1)
BILIRUBIN TOTAL: 0.5 mg/dL (ref 0.2–1.2)
BUN: 14 mg/dL (ref 7–25)
CO2: 25 mmol/L (ref 20–31)
Calcium: 9.1 mg/dL (ref 8.6–10.2)
Chloride: 107 mmol/L (ref 98–110)
Creat: 0.61 mg/dL (ref 0.50–1.10)
GFR, Est Non African American: >89 mL/min (ref >=60)
Glucose, Bld: 93 mg/dL (ref 65–99)
POTASSIUM: 5.1 mmol/L (ref 3.5–5.3)
Sodium: 140 mmol/L (ref 135–146)
TOTAL PROTEIN: 6.8 g/dL (ref 6.1–8.1)

## 2014-12-26 LAB — LIPID PANEL
CHOL/HDL RATIO: 3.1 ratio (ref <=5.0)
CHOLESTEROL: 166 mg/dL (ref 125–200)
HDL: 54 mg/dL (ref >=46)
LDL CALC: 98 mg/dL (ref <130)
TRIGLYCERIDES: 71 mg/dL (ref <150)
VLDL: 14 mg/dL (ref <30)

## 2014-12-27 LAB — VITAMIN D 25 HYDROXY (VIT D DEFICIENCY, FRACTURES): Vit D, 25-Hydroxy: 22 ng/mL — ABNORMAL LOW (ref 30–100)

## 2015-02-16 ENCOUNTER — Emergency Department (HOSPITAL_COMMUNITY): Payer: PRIVATE HEALTH INSURANCE

## 2015-02-16 ENCOUNTER — Emergency Department (HOSPITAL_COMMUNITY)
Admission: EM | Admit: 2015-02-16 | Discharge: 2015-02-16 | Disposition: A | Discharge status: Home / Self Care | Payer: PRIVATE HEALTH INSURANCE | Attending: Emergency Medicine | Admitting: Emergency Medicine

## 2015-02-16 ENCOUNTER — Encounter (HOSPITAL_COMMUNITY): Payer: Self-pay | Admitting: *Deleted

## 2015-02-16 DIAGNOSIS — Z79899 Other long term (current) drug therapy: Secondary | ICD-10-CM | POA: Insufficient documentation

## 2015-02-16 DIAGNOSIS — Z9889 Other specified postprocedural states: Secondary | ICD-10-CM | POA: Insufficient documentation

## 2015-02-16 DIAGNOSIS — Z3202 Encounter for pregnancy test, result negative: Secondary | ICD-10-CM | POA: Insufficient documentation

## 2015-02-16 DIAGNOSIS — R1011 Right upper quadrant pain: Secondary | ICD-10-CM | POA: Insufficient documentation

## 2015-02-16 DIAGNOSIS — M549 Dorsalgia, unspecified: Secondary | ICD-10-CM | POA: Insufficient documentation

## 2015-02-16 DIAGNOSIS — R109 Unspecified abdominal pain: Secondary | ICD-10-CM

## 2015-02-16 LAB — CBC
HCT: 39 % (ref 36.0–46.0)
Hemoglobin: 12.8 g/dL (ref 12.0–15.0)
MCH: 26.7 pg (ref 26.0–34.0)
MCHC: 32.8 g/dL (ref 30.0–36.0)
MCV: 81.3 fL (ref 78.0–100.0)
Platelets: 230 10*3/uL (ref 150–400)
RBC: 4.8 MIL/uL (ref 3.87–5.11)
RDW: 14.6 % (ref 11.5–15.5)
WBC: 9.1 10*3/uL (ref 4.0–10.5)

## 2015-02-16 LAB — URINALYSIS, ROUTINE W REFLEX MICROSCOPIC
Bilirubin Urine: NEGATIVE
Glucose, UA: NEGATIVE mg/dL
Ketones, ur: NEGATIVE mg/dL
Leukocytes, UA: NEGATIVE
NITRITE: NEGATIVE
Protein, ur: NEGATIVE mg/dL
SPECIFIC GRAVITY, URINE: 1.019 (ref 1.005–1.030)
pH: 8 (ref 5.0–8.0)

## 2015-02-16 LAB — URINE MICROSCOPIC-ADD ON

## 2015-02-16 LAB — COMPREHENSIVE METABOLIC PANEL
ALT: 18 U/L (ref 14–54)
AST: 25 U/L (ref 15–41)
Albumin: 3.8 g/dL (ref 3.5–5.0)
Alkaline Phosphatase: 61 U/L (ref 38–126)
Anion gap: 9 (ref 5–15)
BUN: 17 mg/dL (ref 6–20)
CO2: 23 mmol/L (ref 22–32)
Calcium: 9.3 mg/dL (ref 8.9–10.3)
Chloride: 105 mmol/L (ref 101–111)
Creatinine, Ser: 0.77 mg/dL (ref 0.44–1.00)
GFR calc Af Amer: >60 mL/min (ref >60)
GFR calc non Af Amer: >60 mL/min (ref >60)
Glucose, Bld: 164 mg/dL — ABNORMAL HIGH (ref 65–99)
Potassium: 3.8 mmol/L (ref 3.5–5.1)
Sodium: 137 mmol/L (ref 135–145)
Total Bilirubin: 0.3 mg/dL (ref 0.3–1.2)
Total Protein: 6.9 g/dL (ref 6.5–8.1)

## 2015-02-16 LAB — LIPASE, BLOOD: Lipase: 29 U/L (ref 11–51)

## 2015-02-16 LAB — I-STAT BETA HCG BLOOD, ED (MC, WL, AP ONLY): I-stat hCG, quantitative: <5 m[IU]/mL (ref <5)

## 2015-02-16 MED ORDER — HYDROMORPHONE HCL 1 MG/ML IJ SOLN: 0.5000 mg | Freq: Once | INTRAMUSCULAR | Status: DC

## 2015-02-16 MED ORDER — SODIUM CHLORIDE 0.9 % IV BOLUS (SEPSIS)
1000.0000 mL | Freq: Once | INTRAVENOUS | Status: AC
  Administered 2015-02-16: 1000 mL via INTRAVENOUS

## 2015-02-16 NOTE — ED Notes (Signed)
Family at bedside. 

## 2015-02-16 NOTE — ED Provider Notes (Signed)
CSN: [id number]     Arrival date & time 02/16/15  1357 History   First MD Initiated Contact with Patient 02/16/15 1743     Chief Complaint  Patient presents with  . Back Pain     (Consider location/radiation/quality/duration/timing/severity/associated sxs/prior Treatment) HPI Comments: 44 y.o. Female with no significant past medical history presents for back pain.  The patient is accompanied by a family member who is helping to translate.  Patient understands limited [place].  Patient reports that while at work today not long after eating she had the onset of severe pain in the right upper quadrant of her abdomen that radiates to her back.  Denies urinary symptoms, nausea, vomiting, constipation, diarrhea.  Patient denies pain like this before.  No recent fever or chills.  She reports that the pain is hardly there at this time.  No abdominal surgeries other than c-section x2.  Currently on menses.   History reviewed. No pertinent past medical history. Past Surgical History  Procedure Laterality Date  . Cesarean section     Family History  Problem Relation Age of Onset  . Diabetes Mother   . Hypertension Father    Social History  Substance Use Topics  . Smoking status: Never Smoker   . Smokeless tobacco: Never Used  . Alcohol Use: No   OB History    No data available     Review of Systems  Constitutional: Negative for fever, chills and fatigue.  HENT: Negative for congestion, postnasal drip and rhinorrhea.   Eyes: Negative for pain and redness.  Respiratory: Negative for cough, chest tightness and shortness of breath.   Gastrointestinal: Positive for abdominal pain. Negative for nausea, vomiting, diarrhea and constipation.  Genitourinary: Positive for flank pain (right). Negative for dysuria, urgency and hematuria.  Musculoskeletal: Positive for back pain. Negative for myalgias and neck pain.  Neurological: Negative for dizziness, seizures, weakness, numbness and headaches.   Hematological: Negative for adenopathy. Does not bruise/bleed easily.      Allergies  Review of patient's allergies indicates no known allergies.  Home Medications   Prior to Admission medications   Medication Sig Start Date End Date Taking? Authorizing Provider  ferrous sulfate 325 (65 FE) MG tablet Take 325 mg by mouth daily with breakfast.    Historical Provider, MD   BP 112/71 mmHg  Pulse 66  Temp(Src) 98.3 F (36.8 C) (Oral)  Resp 20  SpO2 100%  LMP 02/16/2015 Physical Exam  Constitutional: She is oriented to person, place, and time. She appears well-developed and well-nourished. No distress.  HENT:  Head: Normocephalic and atraumatic.  Right Ear: External ear normal.  Left Ear: External ear normal.  Nose: Nose normal.  Mouth/Throat: Oropharynx is clear and moist. No oropharyngeal exudate.  Eyes: EOM are normal. Pupils are equal, round, and reactive to light.  Neck: Normal range of motion. Neck supple.  Cardiovascular: Normal rate, regular rhythm, normal heart sounds and intact distal pulses.   No murmur heard. Pulmonary/Chest: Effort normal. No respiratory distress. She has no wheezes. She has no rales.  Abdominal: Soft. She exhibits no distension. There is tenderness (mild to moderate) in the right upper quadrant. There is no rigidity, no rebound, no guarding and no tenderness at McBurney's point.  Musculoskeletal: Normal range of motion. She exhibits no edema or tenderness.  Neurological: She is alert and oriented to person, place, and time.  Skin: Skin is warm and dry. No rash noted. She is not diaphoretic.  Vitals reviewed.   ED Course  Procedures (including critical care time) Labs Review Labs Reviewed  COMPREHENSIVE METABOLIC PANEL - Abnormal; Notable for the following:    Glucose, Bld 164 (*)    All other components within normal limits  URINALYSIS, ROUTINE W REFLEX MICROSCOPIC (NOT AT [facility]) - Abnormal; Notable for the following:    APPearance TURBID  (*)    Hgb urine dipstick LARGE (*)    All other components within normal limits  URINE MICROSCOPIC-ADD ON - Abnormal; Notable for the following:    Squamous Epithelial / LPF 0-5 (*)    Bacteria, UA FEW (*)    All other components within normal limits  LIPASE, BLOOD  CBC  I-STAT BETA HCG BLOOD, ED ([facility], [facility], [facility] ONLY)    Imaging Review [place] Abdomen Complete  02/16/2015  CLINICAL DATA:  Abdominal pain, primarily right-sided EXAM: ULTRASOUND ABDOMEN COMPLETE COMPARISON:  None. FINDINGS: Gallbladder: No gallstones or wall thickening visualized. There is no pericholecystic fluid. No sonographic Murphy sign noted. Common bile duct: Diameter: 2 mm. There is no intrahepatic, common hepatic, or common bile duct dilatation. Liver: No focal lesion identified. Within normal limits in parenchymal echogenicity. IVC: No abnormality visualized. Pancreas: No mass or inflammatory focus. Spleen: Size and appearance within normal limits. Right Kidney: Length: 9.8 cm. Echogenicity within normal limits. No mass or hydronephrosis visualized. Left Kidney: Length: 9.9 cm. Echogenicity within normal limits. No mass or hydronephrosis visualized. Abdominal aorta: No aneurysm visualized. Other findings: No demonstrable ascites. IMPRESSION: Study within normal limits. Electronically Signed   By: Bretta BangWilliam  Woodruff III M.D.   On: 02/16/2015 19:06   I have personally reviewed and evaluated these images and lab results as part of my medical decision-making.   EKG Interpretation None      MDM  Patient seen and evaluated in stable condition. Patient's pain improved without intervention and she denied pain medication.  Labs unremarkable.  Hematuria but patient on menses.  US of right upper quadrant and renal system on remarkable. Patient well appearing.  Offered to call translator to go over results but patient declined and asked to use son who was at bedside.  All results and need for follow up were discussed at length with the  patient and her family at bedside who expressed understanding and agreement with plan of care.  Patient was discharged home in stable condition. Final diagnoses:  Flank pain    1. Right upper quadrant abdominal pain  2. Right flank pain    Leta BaptistEmily Roe Nguyen, MD 02/16/15 2028

## 2015-02-16 NOTE — ED Notes (Signed)
Family at bedside, patient is till in ultrasound.

## 2015-02-16 NOTE — Discharge Instructions (Signed)
Dolor en el flanco  (Flank Pain) El dolor en el flanco se refiere a aquel dolor que se localiza en un lado del cuerpo, entre la zona superior del abdomen y Scientific laboratory technicianla espalda. Puede aparecer en un perodo corto de tiempo (agudo) o puede durar mucho tiempo o ser recurrente (crnico). Puede ser leve o intenso. Puede tener diferentes causas.  CAUSAS  Algunas de las causas ms comunes son:   Esguince muscular.   Espasmos musculares.   Problemas en la columna vertebral (enfermedad de los discos).   Infeccin en el pulmn (neumona).   Lquido en los pulmones (edema pulmonar).   Infeccin renal.   Piedras en el rin.   Una erupcin cutnea muy dolorosa causada por el virus de la varicela (herpes zoster).  Enfermedad de la vescula biliar. INSTRUCCIONES PARA EL CUIDADO EN EL HOGAR  Los cuidados en el hogar dependern de la causa del dolor. En general:   Haga reposo segn las indicaciones del mdico.  Debe ingerir gran cantidad de lquido para mantener la orina de tono claro o color amarillo plido.  Tome slo medicamentos de venta libre o recetados, segn las indicaciones del mdico. Algunos medicamentos ayudan a Engineer, materialsaliviar el dolor.  Informe al mdico si tiene algn Arboriculturistcambio en el dolor.  Concurra a las visitas de control, segn las indicaciones. SOLICITE ATENCIN MDICA DE INMEDIATO SI:   El dolor no se alivia con los United Parcelmedicamentos.   Tiene sntomas nuevos o que empeoran.  El dolor Farinaaumenta.   Siente dolor abdominal.   Le falta el aire.   Tiene nuseas o vmitos persistentes.   El abdomen se hincha.   Sufre mareos o se desmaya.   Observa sangre en la orina.  Tiene fiebre o sntomas persistentes durante ms de 2  3 das.  Tiene fiebre y los sntomas empeoran repentinamente. ASEGRESE DE QUE:   Comprende estas instrucciones.  Controlar su enfermedad.  Solicitar ayuda de inmediato si no mejora o si empeora.   Esta informacin no tiene Theme park managercomo fin reemplazar  el consejo del mdico. Asegrese de hacerle al mdico cualquier pregunta que tenga.   Document Released: 06/14/2007 Document Revised: 11/30/2011 Elsevier Interactive Patient Education 2016 [employer]   Dolor abdominal en adultos (Abdominal Pain, Adult) El dolor puede tener muchas causas. Normalmente la causa del dolor abdominal no es una enfermedad y Scientist, clinical (histocompatibility and immunogenetics)mejorar sin TEFL teachertratamiento. Frecuentemente puede controlarse y tratarse en casa. Su mdico le Medical sales representativerealizar un examen fsico y posiblemente solicite anlisis de sangre y radiografas para ayudar a Chief Strategy Officerdeterminar la gravedad de su dolor. Sin embargo, en IAC/InterActiveCorpmuchos casos, debe transcurrir ms tiempo antes de que se pueda Clinical research associateencontrar una causa evidente del dolor. Antes de llegar a ese punto, es posible que su mdico no sepa si necesita ms pruebas o un tratamiento ms profundo. INSTRUCCIONES PARA EL CUIDADO EN EL HOGAR  Est atento al dolor para ver si hay cambios. Las siguientes indicaciones ayudarn a Architectural technologistaliviar cualquier molestia que pueda sentir:  Sterlingome solo medicamentos de venta libre o recetados, segn las indicaciones del mdico.  No tome laxantes a menos que se lo haya indicado su mdico.  Pruebe con Neomia Dearuna dieta lquida absoluta (caldo, t o agua) segn se lo indique su mdico. Introduzca gradualmente una dieta normal, segn su tolerancia. SOLICITE ATENCIN MDICA SI:  Tiene dolor abdominal sin explicacin.  Tiene dolor abdominal relacionado con nuseas o diarrea.  Tiene dolor cuando orina o defeca.  Experimenta dolor abdominal que lo despierta de noche.  Tiene dolor abdominal que empeora o mejora  cuando come alimentos.  Tiene dolor abdominal que empeora cuando come alimentos grasosos.  Tiene fiebre. SOLICITE ATENCIN MDICA DE INMEDIATO SI:   El dolor no desaparece en un plazo mximo de 2horas.  No deja de (vomitar).  El Engineer, mining se siente solo en partes del abdomen, como el lado derecho o la parte inferior izquierda del abdomen.  Evaca  materia fecal sanguinolenta o negra, de aspecto alquitranado. ASEGRESE DE QUE:  Comprende estas instrucciones.  Controlar su afeccin.  Recibir ayuda de inmediato si no mejora o si empeora.   Esta informacin no tiene Theme park manager el consejo del mdico. Asegrese de hacerle al mdico cualquier pregunta que tenga.   Document Released: 03/07/2005 Document Revised: 03/28/2014 Elsevier Interactive Patient Education [employer]

## 2015-02-16 NOTE — ED Notes (Signed)
Patient is alert and orientedx4.  Patient was explained discharge instructions and they understood them with no questions.  Husband is driving her home.

## 2015-02-16 NOTE — ED Notes (Signed)
Pt reports bilateral back and flank pain that started approx 1 hour ago. Also reports chills, denies pain with urination.

## 2015-02-16 NOTE — ED Notes (Signed)
MD at bedside. 

## 2015-02-16 NOTE — ED Notes (Signed)
The patient says she is having her period more frequent than she should.

## 2015-04-08 ENCOUNTER — Encounter: Payer: Self-pay | Admitting: Internal Medicine

## 2015-04-08 ENCOUNTER — Ambulatory Visit: Payer: PRIVATE HEALTH INSURANCE | Attending: Internal Medicine | Admitting: Internal Medicine

## 2015-04-08 VITALS — BP 142/86 | HR 60 | Temp 97.8°F | Resp 16 | Ht 60.0 in | Wt 152.4 lb

## 2015-04-08 DIAGNOSIS — F419 Anxiety disorder, unspecified: Secondary | ICD-10-CM

## 2015-04-08 DIAGNOSIS — G479 Sleep disorder, unspecified: Secondary | ICD-10-CM

## 2015-04-08 MED ORDER — HYDROXYZINE HCL 25 MG PO TABS: 25.0000 mg | ORAL_TABLET | Freq: Every evening | ORAL | Status: DC

## 2015-04-08 NOTE — Patient Instructions (Signed)
Melatonin 5 mg may get at [facility] or any pharmacy.

## 2015-04-08 NOTE — Progress Notes (Signed)
Patient ID: Sharon Richardson, female   DOB: April 25, 1970, 45 y.o.   MRN: [id number]  CC: sleep difficulties  HPI: Sharon Richardson is a 45 y.o. female here today for a follow up visit.  Patient has past medical history of anemia. Patient reports that for the past week she has been having difficulty sleeping. She reports that she only sleeps around 3 hours per night. States that she has been stressed. She has tried a OTC sleep aid without relief. Patient also endorses decreased appetite, numbness in chin, and nausea.  .  Patient has No headache, No chest pain, No abdominal pain - No Nausea, No new weakness No Cough - SOB.  No Known Allergies History reviewed. No pertinent past medical history. Current Outpatient Prescriptions on File Prior to Visit  Medication Sig Dispense Refill  . ferrous sulfate 325 (65 FE) MG tablet Take 325 mg by mouth daily with breakfast.     No current facility-administered medications on file prior to visit.   Family History  Problem Relation Age of Onset  . Diabetes Mother   . Hypertension Father    Social History   Social History  . Marital Status: Married    Spouse Name: N/A  . Number of Children: N/A  . Years of Education: N/A   Occupational History  . Not on file.   Social History Main Topics  . Smoking status: Never Smoker   . Smokeless tobacco: Never Used  . Alcohol Use: No  . Drug Use: No  . Sexual Activity: Not on file   Other Topics Concern  . Not on file   Social History Narrative    Review of Systems: Other than what is stated in HPI, all other systems are negative.   Objective:   Filed Vitals:   04/08/15 1120  BP: 142/86  Pulse: 60  Temp: 97.8 F (36.6 C)  Resp: 16    Physical Exam  Constitutional: She is oriented to person, place, and time.  Cardiovascular: Normal rate, regular rhythm and normal heart sounds.   Pulmonary/Chest: Effort normal and breath sounds normal.  Musculoskeletal: Normal range  of motion.  Neurological: She is alert and oriented to person, place, and time. No cranial nerve deficit. Coordination normal.  Skin: Skin is warm and dry.  Psychiatric:  anxious     Lab Results  Component Value Date   WBC 9.1 02/16/2015   HGB 12.8 02/16/2015   HCT 39.0 02/16/2015   MCV 81.3 02/16/2015   PLT 230 02/16/2015   Lab Results  Component Value Date   CREATININE 0.77 02/16/2015   BUN 17 02/16/2015   NA 137 02/16/2015   K 3.8 02/16/2015   CL 105 02/16/2015   CO2 23 02/16/2015    Lab Results  Component Value Date   HGBA1C 5.6 10/01/2013   Lipid Panel     Component Value Date/Time   CHOL 166 12/26/2014 0910   TRIG 71 12/26/2014 0910   HDL 54 12/26/2014 0910   CHOLHDL 3.1 12/26/2014 0910   VLDL 14 12/26/2014 0910   LDLCALC 98 12/26/2014 0910       Assessment and plan:   Diagnoses and all orders for this visit:  Anxiety -    Begin hydrOXYzine (ATARAX/VISTARIL) 25 MG tablet; Take 1 tablet (25 mg total) by mouth every evening. For anxiety Multiple symptoms and history sound consistent with anxiety. Vistaril will help with sleep as well.   Sleep difficulties See above. If no improvement in sleep she may try OTC  Melatonin but explained that she should not take the two pills to close together in time to prevent over sedation.  Due to language barrier, an interpreter was present during the history-taking and subsequent discussion (and for part of the physical exam) with this patient.  Return if symptoms worsen or fail to improve.       Ambrose Finland, NP-C [facility] [phone] 04/08/2015, 11:35 AM

## 2015-04-08 NOTE — Progress Notes (Signed)
Virtual interpreter used [place] Noe ID# [id number] Patient complains of not being able to sleep for the past week Has a decreased appetite and having some numbness around her chin area

## 2015-05-19 ENCOUNTER — Encounter: Payer: Self-pay | Admitting: Clinical

## 2015-05-19 NOTE — Progress Notes (Signed)
Depression screen [facility] 2/9 04/08/2015 09/24/2013  Decreased Interest 2 0  Down, Depressed, Hopeless 1 0  PHQ - 2 Score 3 0  Altered sleeping 3 -  Tired, decreased energy 2 -  Change in appetite 1 -  Feeling bad or failure about yourself  0 -  Trouble concentrating 2 -  Moving slowly or fidgety/restless 1 -  Suicidal thoughts 0 -  PHQ-9 Score 12 -    GAD 7 : Generalized Anxiety Score 04/08/2015  Nervous, Anxious, on Edge 0  Control/stop worrying 1  Worry too much - different things 0  Trouble relaxing 1  Restless 1  Easily annoyed or irritable 0  Afraid - awful might happen 0  Total GAD 7 Score 3

## 2015-06-05 ENCOUNTER — Ambulatory Visit: Payer: PRIVATE HEALTH INSURANCE | Attending: Internal Medicine

## 2015-08-06 ENCOUNTER — Encounter: Payer: Self-pay | Admitting: Physician Assistant

## 2015-08-06 ENCOUNTER — Ambulatory Visit: Payer: PRIVATE HEALTH INSURANCE | Attending: Physician Assistant | Admitting: Physician Assistant

## 2015-08-06 VITALS — BP 129/81 | HR 69 | Temp 97.5°F | Wt 159.8 lb

## 2015-08-06 DIAGNOSIS — B359 Dermatophytosis, unspecified: Secondary | ICD-10-CM | POA: Insufficient documentation

## 2015-08-06 DIAGNOSIS — B356 Tinea cruris: Secondary | ICD-10-CM

## 2015-08-06 DIAGNOSIS — D649 Anemia, unspecified: Secondary | ICD-10-CM

## 2015-08-06 DIAGNOSIS — Z79899 Other long term (current) drug therapy: Secondary | ICD-10-CM | POA: Insufficient documentation

## 2015-08-06 DIAGNOSIS — R739 Hyperglycemia, unspecified: Secondary | ICD-10-CM | POA: Insufficient documentation

## 2015-08-06 LAB — COMPREHENSIVE METABOLIC PANEL
ALBUMIN: 4 g/dL (ref 3.6–5.1)
ALT: 24 U/L (ref 6–29)
AST: 26 U/L (ref 10–30)
Alkaline Phosphatase: 54 U/L (ref 33–115)
BILIRUBIN TOTAL: 0.4 mg/dL (ref 0.2–1.2)
BUN: 15 mg/dL (ref 7–25)
CO2: 24 mmol/L (ref 20–31)
CREATININE: 0.54 mg/dL (ref 0.50–1.10)
Calcium: 8.7 mg/dL (ref 8.6–10.2)
Chloride: 104 mmol/L (ref 98–110)
GLUCOSE: 92 mg/dL (ref 65–99)
Potassium: 4.5 mmol/L (ref 3.5–5.3)
SODIUM: 136 mmol/L (ref 135–146)
Total Protein: 6.8 g/dL (ref 6.1–8.1)

## 2015-08-06 LAB — CBC WITH DIFFERENTIAL/PLATELET
BASOS ABS: 0 {cells}/uL (ref 0–200)
Basophils Relative: 0 %
EOS ABS: 171 {cells}/uL (ref 15–500)
Eosinophils Relative: 3 %
HEMATOCRIT: 36.9 % (ref 35.0–45.0)
HEMOGLOBIN: 11.3 g/dL — AB (ref 11.7–15.5)
LYMPHS ABS: 1881 {cells}/uL (ref 850–3900)
Lymphocytes Relative: 33 %
MCH: 23.9 pg — AB (ref 27.0–33.0)
MCHC: 30.6 g/dL — ABNORMAL LOW (ref 32.0–36.0)
MCV: 78.2 fL — ABNORMAL LOW (ref 80.0–100.0)
MONO ABS: 456 {cells}/uL (ref 200–950)
MPV: 11.5 fL (ref 7.5–12.5)
Monocytes Relative: 8 %
NEUTROS PCT: 56 %
Neutro Abs: 3192 cells/uL (ref 1500–7800)
Platelets: 228 10*3/uL (ref 140–400)
RBC: 4.72 MIL/uL (ref 3.80–5.10)
RDW: 15.2 % — ABNORMAL HIGH (ref 11.0–15.0)
WBC: 5.7 10*3/uL (ref 3.8–10.8)

## 2015-08-06 LAB — HEMOGLOBIN A1C
Hgb A1c MFr Bld: 5.6 % (ref <5.7)
Mean Plasma Glucose: 114 mg/dL

## 2015-08-06 LAB — GLUCOSE, POCT (MANUAL RESULT ENTRY): POC Glucose: 87 mg/dl (ref 70–99)

## 2015-08-06 MED ORDER — KETOCONAZOLE 2 % EX CREA: 1.0000 "application " | TOPICAL_CREAM | Freq: Every day | CUTANEOUS | Status: DC

## 2015-08-06 MED FILL — KETOCONAZOLE 2% CREAM: 2 | 25 days supply | Qty: 60 | Fill #0

## 2015-08-06 NOTE — Progress Notes (Signed)
Patient ID: Sharon Richardson, female   DOB: 1970-05-03, 45 y.o.   MRN: [id number]   Sharon Richardson, is a 45 y.o. female  [id number]  [id number]  DOB - 18-Jun-1970  Chief Complaint  Patient presents with  . Pruritis    Groin area x 2 wks  . Pruritis    Left foot x 1 mth        Subjective:  Chief Complaint and HPI: Sharon Richardson is a 45 y.o. female here today for a rash in her groin and on her feet X 2-3 weeks.  +Pruritis.  She is unaware that she had a high blood sugar in November of 2016.  She denies s/sx of diabetes.  She has not used any OTC medications.  No precipitating factors.     ROS:   Constitutional:  No f/c, No night sweats, No unexplained weight loss. EENT:  No vision changes, No blurry vision, No hearing changes. No mouth, throat, or ear problems.  Respiratory: No cough, No SOB Cardiac: No CP, no palpitations GI:  No abd pain, No N/V/D. GU: No Urinary s/sx; no vaginal itching or discharge.  Musculoskeletal: No joint pain Neuro: No headache, no dizziness, no motor weakness.  Skin: +rash Endocrine:  No polydipsia. No polyuria.  Psych: Denies SI/HI  No problems updated.  ALLERGIES: No Known Allergies  PAST MEDICAL HISTORY: No past medical history on file.  MEDICATIONS AT HOME: Prior to Admission medications   Medication Sig Start Date End Date Taking? Authorizing Provider  ferrous sulfate 325 (65 FE) MG tablet Take 325 mg by mouth daily with breakfast. Reported on 08/06/2015    Historical Provider, MD  hydrOXYzine (ATARAX/VISTARIL) 25 MG tablet Take 1 tablet (25 mg total) by mouth every evening. For anxiety Patient not taking: Reported on 08/06/2015 04/08/15   Ambrose Finland, NP  ketoconazole (NIZORAL) 2 % cream Apply 1 application topically daily. X 4 weeks 08/06/15   Anders Simmonds, PA-C     Objective:  EXAM:   Filed Vitals:   08/06/15 0934  BP: 129/81  Pulse: 69  Temp: 97.5 F (36.4 C)  TempSrc: Oral    Weight: 159 lb 12.8 oz (72.485 kg)    General appearance : A&OX3. NAD. Non-toxic-appearing HEENT: Atraumatic and Normocephalic.  PERRLA. EOM intact.  TM clear B. Mouth-MMM, post pharynx WNL w/o erythema, No PND. Neck: supple, no JVD. No cervical lymphadenopathy. No thyromegaly Chest/Lungs:  Breathing-non-labored, Good air entry bilaterally, breath sounds normal without rales, rhonchi, or wheezing  CVS: S1 S2 regular, no murmurs, gallops, rubs  Extremities: Bilateral Lower Ext shows no edema, both legs are warm to touch with = pulse throughout Neurology:  CN II-XII grossly intact, Non focal.   Psych:  TP linear. J/I WNL. Normal speech. Appropriate eye contact and affect.  Skin:  Inguinal region and B feet with beefy erythema and hyperpigmentation with satellite lesions on surrounding skin-no secondary infection.  B inguinal region affected and both lateral aspects of the feet are involved.   Data Review Lab Results  Component Value Date   HGBA1C 5.6 10/01/2013   HGBA1C 5.6 06/26/2009     Assessment & Plan   1. Hyperglycemia in November 2016 - POCT glucose (manual entry)-normal today; 87 - Comprehensive metabolic panel - Hemoglobin A1c  2. Anemia, unspecified anemia type-not currently taking iron.  - Comprehensive metabolic panel - CBC with Differential/Platelet  3. Tinea of groin and feet - ketoconazole (NIZORAL) 2 % cream; Apply 1 application topically daily. X 4 weeks  Dispense: 60 g; Refill: 0  Patient have been counseled extensively about nutrition and exercise  Return in about 6 months (around 02/06/2016) for cpe.  The patient was given clear instructions to go to ER or return to medical center if symptoms don't improve, worsen or new problems develop. The patient verbalized understanding. The patient was told to call to get lab results if they haven't heard anything in the next week.     Georgian CoAngela McClung, PA-C [facility]  [place], [place] [phone]   08/06/2015, 11:43 AM

## 2015-08-06 NOTE — Patient Instructions (Signed)
Pie de atleta  (Athlete's Foot)  El pie de atleta (tinea pedis) es una infección por hongos en la piel de los pies. Generalmente aparece en la piel que se encuentra entre los dedos o debajo de los mismos. También puede aparecer en la planta de los pies. El pie de atleta se produce con más frecuencia cuando el clima es cálido y húmedo. La falta de higiene en los pies o no cambiarse los calcetines con frecuencia puede contribuir a la aparición del pie de atleta. La infección puede transmitirse de una persona a otra (escontagiosa).   CAUSAS  La causa del pie de atleta es un hongo. Este hongo se desarrolla en lugares cálidos y húmedos. La mayoría de las personas se contagian al compartir duchas, toallas y pisos mojados con una persona infectada. Las personas con el sistema inmunológico débil, incluidas la personas con diabetes, son más propensas al pie de atleta.  SÍNTOMAS  · Picazón entre los dedos o en las plantas de los pies.  · Aparecen zonas blanquecinas o escamosas entre los dedos o las plantas de los pies.  · Pueden aparecer también pequeñas ampollas que producen una picazón intensa.  · Pequeños cortes en la piel. En estos cortes puede desarrollarse una infección bacteriana.  · Las uñas de los pies pueden volverse más finas o descoloridas.  DIAGNÓSTICO  El médico puede diagnosticar el problema haciendo un examen físico. También podrá tomar una muestra de piel de la zona de la erupción. La muestra de piel es examinada en el microscopio o puede realizarse una prueba para ver si se desarrollan hongos. También podrá tomarse una muestra de la uña para analizar.   TRATAMIENTO  Para eliminar los hongos se utilizan medicamentos de venta libre o recetados. Estos medicamentos están disponibles en forma de polvos o cremas. El médico podrá sugerirle medicamentos. Las infecciones por hongos responden lentamente al tratamiento. Puede ser que necesite usar el medicamento durante varias semanas.   PREVENCIÓN   · No comparta  toallas.  · Use sandalias cuando tenga que pisar zonas húmedas. como duchas y vestuarios compartidos.  · Mantenga sus pies secos. Use zapatos que permitan la circulación de aire. Use calcetines de algodón o lana.  INSTRUCCIONES PARA EL CUIDADO DOMICILIARIO  · Tome todos los medicamentos según le indicó su médico. No se aplique cremas con corticoides en el pie de atleta.  · Mantenga los pies limpios y frescos. Lave sus pies todos los días y séquelos cuidadosamente, especialmente entre los dedos.  · Cámbiese los calcetines todos los días. Use calcetines de algodón o lana. En climas cálidos puede ser necesario cambiarse los calcetines 2 ó 3 veces por día.  · Use sandalias o zapatillas de lona que tengan buena ventilación.  · Si tiene ampollas, remoje los pies en solución de [place] o sales de [place] durante 20 a 30 minutos 2 veces por día para secar las ampollas. Luego asegúrese de secarse los pies cuidadosamente.  SOLICITE ATENCIÓN MÉDICA SI:  · Tiene fiebre.  · Aumenta la hinchazón, el dolor, el calor o el enrojecimiento en el pie.  · No mejora luego de 7 días de tratamiento.  · No se cura completamente luego de 30 días.  · Tiene problemas relacionados con los medicamentos.  ESTÉ SEGURO QUE:   · Comprende las instrucciones para el alta médica.  · Controlará su enfermedad.  · Solicitará atención médica de inmediato según las indicaciones.     Esta información no tiene como fin reemplazar el consejo del médico.   Asegúrese de hacerle al médico cualquier pregunta que tenga.     Document Released: 03/07/2005 Document Revised: 05/30/2011  Elsevier Interactive Patient Education ©2016 Elsevier Inc.

## 2015-08-07 ENCOUNTER — Other Ambulatory Visit: Payer: Self-pay | Admitting: Physician Assistant

## 2015-08-07 MED ORDER — FERROUS SULFATE 325 (65 FE) MG PO TABS: 325.0000 mg | ORAL_TABLET | Freq: Every day | ORAL | Status: DC

## 2015-08-20 ENCOUNTER — Telehealth: Payer: Self-pay | Admitting: Internal Medicine

## 2015-08-20 NOTE — Telephone Encounter (Signed)
Pt. Came into facility requesting her lab results. Please f/u with pt.  °

## 2015-09-23 ENCOUNTER — Telehealth: Payer: Self-pay | Admitting: *Deleted

## 2015-09-23 NOTE — Telephone Encounter (Signed)
Patient received results in the office on 08/27/15.

## 2015-11-24 ENCOUNTER — Ambulatory Visit: Payer: Self-pay | Attending: Internal Medicine | Admitting: Internal Medicine

## 2015-11-24 ENCOUNTER — Encounter: Payer: Self-pay | Admitting: Internal Medicine

## 2015-11-24 ENCOUNTER — Encounter (INDEPENDENT_AMBULATORY_CARE_PROVIDER_SITE_OTHER): Payer: Self-pay

## 2015-11-24 VITALS — BP 121/80 | HR 72 | Temp 98.3°F | Resp 16 | Wt 163.8 lb

## 2015-11-24 DIAGNOSIS — J309 Allergic rhinitis, unspecified: Secondary | ICD-10-CM | POA: Insufficient documentation

## 2015-11-24 DIAGNOSIS — H6123 Impacted cerumen, bilateral: Secondary | ICD-10-CM | POA: Insufficient documentation

## 2015-11-24 DIAGNOSIS — E669 Obesity, unspecified: Secondary | ICD-10-CM | POA: Insufficient documentation

## 2015-11-24 DIAGNOSIS — Z23 Encounter for immunization: Secondary | ICD-10-CM | POA: Insufficient documentation

## 2015-11-24 MED ORDER — FLUTICASONE PROPIONATE 50 MCG/ACT NA SUSP: 2.0000 | Freq: Every day | NASAL | 6 refills | Status: DC

## 2015-11-24 MED ORDER — CARBAMIDE PEROXIDE 6.5 % OT SOLN: 5.0000 [drp] | Freq: Two times a day (BID) | OTIC | 0 refills | Status: DC

## 2015-11-24 MED FILL — FLUTICASONE PROP 50 MCG SPR: 50 | 20 days supply | Qty: 16 | Fill #0

## 2015-11-24 MED FILL — EAR DROPS 6.5%: 6.5 | 10 days supply | Qty: 15 | Fill #0

## 2015-11-24 NOTE — Progress Notes (Signed)
Pt states she is having pain in her left leg Pt states the pain only starts when she  Pt states she is very congested No difficulty breathing Has been taking rubintson and dayquil/nightquil

## 2015-11-24 NOTE — Patient Instructions (Addendum)
Rinitis alrgica (Allergic Rhinitis) La rinitis alrgica ocurre cuando las membranas mucosas de la nariz responden a los alrgenos. Los alrgenos son las partculas que estn en el aire y que hacen que el cuerpo tenga una reaccin IT consultant. Esto hace que usted libere anticuerpos alrgicos. A travs de una cadena de eventos, estos finalmente hacen que usted libere histamina en la corriente sangunea. Aunque la funcin de la histamina es proteger al organismo, es esta liberacin de histamina lo que provoca malestar, como los estornudos frecuentes, la congestin y goteo y Engineer, petroleum.  CAUSAS La causa de la rinitis Regulatory affairs officer (fiebre del heno) son los alrgenos del polen que pueden provenir del csped, los rboles y Human resources officer. La causa de la rinitis Stage manager (rinitis alrgica perenne) son los alrgenos, como los caros del polvo domstico, la caspa de las mascotas y las esporas del moho. SNTOMAS  Secrecin nasal (congestin).  Goteo y picazn nasales con estornudos y Industrial/product designer. DIAGNSTICO Su mdico puede ayudarlo a Actor alrgeno o los alrgenos que desencadenan sus sntomas. Si usted y su mdico no pueden Teacher, adult education cul es el alrgeno, pueden hacerse anlisis de sangre o estudios de la piel. El mdico diagnosticar la afeccin despus de hacerle una historia clnica y un examen fsico. Adems, puede evaluarlo para detectar la presencia de otras enfermedades afines, como asma, conjuntivitis u otitis. TRATAMIENTO La rinitis alrgica no tiene [place], pero puede controlarse con lo siguiente:  Medicamentos que [employer] sntomas de [place], por ejemplo, vacunas contra la [place], aerosoles nasales y antihistamnicos por va oral.  Evitar el alrgeno. La fiebre del heno a menudo puede tratarse con antihistamnicos en las formas de pldoras o aerosol nasal. Los antihistamnicos bloquean los efectos de la histamina. Existen medicamentos de venta libre que pueden ayudar con  la congestin nasal y la hinchazn alrededor de los ojos. Consulte a su mdico antes de tomar o administrarse este medicamento. Si la prevencin del alrgeno o el medicamento recetado no dan resultado, existen muchos medicamentos nuevos que su mdico puede recetarle. Pueden usarse medicamentos ms fuertes si las medidas iniciales no son efectivas. Pueden aplicarse inyecciones desensibilizantes si los medicamentos y la prevencin no funcionan. La desensibilizacin ocurre cuando un paciente recibe vacunas constantes hasta que el cuerpo se vuelve menos sensible al alrgeno. Asegrese de Chartered certified accountant seguimiento con su mdico si los problemas continan. INSTRUCCIONES PARA EL CUIDADO EN EL HOGAR No es posible evitar por completo los alrgenos, pero puede reducir los sntomas al tomar medidas para limitar su exposicin a ellos. Es muy til saber exactamente a qu es alrgico para que pueda evitar sus desencadenantes especficos. SOLICITE ATENCIN MDICA SI:  Jaclynn Guarneri.  Desarrolla una tos que no cesa fcilmente (persistente).  Le falta el aire.  Comienza a tener sibilancias.  Los sntomas interfieren con las actividades diarias normales.   Esta informacin no tiene Marine scientist el consejo del mdico. Asegrese de hacerle al mdico cualquier pregunta que tenga.   Document Released: 12/15/2004 Document Revised: 03/28/2014 Elsevier Interactive Patient Education 2016 [place] diabetes mellitus y los alimentos (Diabetes Mellitus and Food) Es importante que controle su nivel de azcar en la sangre (glucosa). El nivel de glucosa en sangre depende en gran medida de lo que usted come. Comer alimentos saludables en las cantidades [place] a lo largo del Training and development officer, aproximadamente a la misma hora [employer], lo ayudar a Chief Technology Officer su nivel de Multimedia programmer. Tambin puede ayudarlo a retrasar o Patent attorney  de la diabetes mellitus. Comer de [employer] saludable incluso puede  ayudarlo a Chartered loss adjuster de presin arterial y a Science writer o Theatre manager un peso saludable.  Entre las recomendaciones generales para alimentarse y Audiological scientist los alimentos de forma saludable, se incluyen las siguientes:  Respetar las comidas principales y comer colaciones con regularidad. Evitar pasar largos perodos sin comer con el fin de perder peso.  Seguir una dieta que consista principalmente en alimentos de origen vegetal, como frutas, vegetales, frutos secos, legumbres y cereales integrales.  Utilizar mtodos de coccin a baja temperatura, como hornear, en lugar de mtodos de coccin a alta temperatura, como frer en abundante aceite. Trabaje con el nutricionista para aprender a Financial planner nutricional de las etiquetas de los alimentos. CMO PUEDEN AFECTARME LOS ALIMENTOS? Carbohidratos Los carbohidratos afectan el nivel de glucosa en sangre ms que cualquier otro tipo de alimento. El nutricionista lo ayudar a Teacher, adult education cuntos carbohidratos puede consumir en cada comida y ensearle a contarlos. El recuento de carbohidratos es importante para mantener la glucosa en sangre en un nivel saludable, en especial si utiliza insulina o toma determinados medicamentos para la diabetes mellitus. Alcohol El alcohol puede provocar disminuciones sbitas de la glucosa en sangre (hipoglucemia), en especial si utiliza insulina o toma determinados medicamentos para la diabetes mellitus. La hipoglucemia es una afeccin que puede poner en peligro la vida. Los sntomas de la hipoglucemia (somnolencia, mareos y Data processing manager) son similares a los sntomas de haber consumido mucho alcohol.  Si el mdico lo autoriza a beber alcohol, hgalo con moderacin y siga estas pautas:  Las mujeres no deben beber ms de un trago por da, y los hombres no deben beber ms de dos tragos por Training and development officer. Un trago es igual a:  12 onzas (355 ml) de cerveza  5 onzas de vino (150 ml) de vino  1,5onzas (9m) de bebidas  espirituosas  No beba con el estmago vaco.  Mantngase hidratado. Beba agua, gaseosas dietticas o t helado sin azcar.  Las gaseosas comunes, los jugos y otros refrescos podran contener muchos carbohidratos y se dCivil Service fast streamer QU ALIMENTOS NO SE RECOMIENDAN? Cuando haga las elecciones de alimentos, es importante que recuerde que todos los alimentos son distintos. Algunos tienen menos nutrientes que otros por porcin, aunque podran tener la misma cantidad de caloras o carbohidratos. Es difcil darle al cuerpo lo que necesita cuando consume alimentos con menos nutrientes. Estos son algunos ejemplos de alimentos que debera evitar ya que contienen muchas caloras y carbohidratos, pero pocos nutrientes:  GPhysicist, medicaltrans (la mayora de los alimentos procesados incluyen grasas trans en la etiqueta de Informacin nutricional).  Gaseosas comunes.  Jugos.  Caramelos.  Dulces, como tortas, pasteles, rosquillas y g[place]  Comidas fritas. QU ALIMENTOS PUEDO COMER? Consuma alimentos ricos en nutrientes, que nutrirn el cuerpo y lo mantendrn saludable. Los alimentos que debe comer tambin dependern de varios factores, como:  Las caloras que necesita.  Los medicamentos que toma.  Su peso.  El nivel de glucosa en sKinbrae  El nTerrace Heightsde presin arterial.  El nivel de colesterol. Debe consumir una amplia variedad de alimentos, por ejemplo:  Protenas.  Cortes de cNucor Corporation  Protenas con bajo contenido de grasas saturadas, como pescado, clara de huevo y frijoles. Evite las carnes procesadas.  Frutas y vegetales.  Frutas y vPhotographerque pueden ayudar a cChief Technology Officerlos niveles sanguneos de gCokedale como [place] mangos y batatas.  Productos lcteos.  Elija productos lcteos sin grasa o con bajo contenido de gSpeers cMidland  yogur y [place].  Cereales, panes, pastas y arroz.  Elija cereales integrales, como panes multicereales, avena en grano y arroz integral. Estos  alimentos pueden ayudar a controlar la presin arterial.  Daphene Jaeger.  Alimentos que contengan grasas saludables, como frutos secos, Musician, aceite de [place], aceite de canola y pescado. TODOS LOS QUE PADECEN DIABETES MELLITUS TIENEN EL [place] PLAN DE G. L. Garcia? Dado que todas las personas que padecen diabetes mellitus son distintas, no hay un solo plan de comidas que funcione para todos. Es muy importante que se rena con un nutricionista que lo ayudar a crear un plan de comidas adecuado para usted.   Esta informacin no tiene Marine scientist el consejo del mdico. Asegrese de hacerle al mdico cualquier pregunta que tenga.   Document Released: 06/14/2007 Document Revised: 03/28/2014 Elsevier Interactive Patient Education 2016 [place] para comer fuera de su casa si tiene diabetes (Tips for Eating Away From Home If You Have Diabetes) El control del nivel de glucemia, que tambin se conoce como azcar en la [place], puede ser un reto, que se complica an ms cuando uno no prepara sus propias comidas. Los siguientes consejos pueden ayudarlo a Chief Technology Officer la diabetes cuando come fuera de su casa. PLANIFICACIN Organcese si sabe que comer fuera de su casa:  Pregntele al mdico cmo sincronizar las comidas y el medicamento si est en tratamiento con insulina.  Haga una lista de restaurantes cercanos que ofrezcan opciones saludables. Si tienen un men que pueda leer en su casa, llvelo y planifique lo que pedir con anticipacin.  Busque informacin en lnea del restaurante donde quiera comer. Muchos restaurantes de comida rpida y cadenas de restaurantes incluyen la informacin nutricional en lnea. Tenga en cuenta esta informacin para elegir las opciones ms saludables y calcular los carbohidratos de la comida.  Use un libro de recuento de carbohidratos o una aplicacin mvil para fijarse en el contenido de carbohidratos y el tamao de porcin de lo que desea  comer.  Comience a Corporate treasurer de las porciones y a Marine scientist cuntas porciones hay en una unidad. Esto le permitir calcular la cantidad de carbohidratos que puede comer. ALIMENTOS LIBRES Un "alimento libre" es cualquier alimento o bebida que contenga menos de 5g de carbohidratos por porcin. Entre los alimentos libres, se incluyen los siguientes:  Muchos vegetales.  Huevos duros.  Nueces o semillas.  Aceitunas.  Quesos.  Carnes. Estos tipos de alimentos son buenas opciones de bocadillos y en general estn disponibles en los bufs de ensaladas. Como aderezos "libres" para [place], puede usarse jugo de limn, vinagre o un aderezo de bajas caloras (con menos de 20caloras por porcin).  OPCIONES PARA REDUCIR LOS CARBOHIDRATOS  Reemplace el yogur descremado endulzado por el yogur sin azcar. Tambin puede consumir yogur a base de [place] de [place], pero es conveniente una opcin sin azcar o natural, porque tiene menos contenido de carbohidratos.  Pdale al mozo que retire la canasta de pan o las papas de la mesa.  Pida frutas frescas. El buf de ensaladas a menudo ofrece frutas frescas. Evite las frutas enlatadas, ya que por lo general tienen azcar o almbar.  Pida una ensalada y cmala sin aderezo. Tambin puede crear un aderezo "libre" para ensaladas.  Pida que le [employer] alimentos. Por ejemplo, en lugar de papas fritas, pida una porcin de vegetales, como una ensalada, judas verdes o brcoli. OTROS CONSEJOS   Si [place] insulina, adminstrela una vez que la comida llegue a la [place], as  las sincronizar correctamente.  Pregntele al mozo sobre el tamao de la porcin antes de pedir la comida y, si la porcin es ms grande de lo que usted debe consumir, pida una caja para llevarse la comida a su casa. Cuando llegue la comida, deje en el plato la cantidad que debe comer y coloque el resto en la caja para llevar.  Considere la posibilidad de Publishing rights manager un plato principal  con alguien y de pedir una ensalada como guarnicin.   Esta informacin no tiene Marine scientist el consejo del mdico. Asegrese de hacerle al mdico cualquier pregunta que tenga.   Document Released: 03/07/2005 Document Revised: 11/26/2014 Elsevier Interactive Patient Education [employer].   -  Diabetes y actividad fsica (Diabetes and Exercise) Hacer actividad fsica con regularidad es muy importante. No se trata solo de [employer]. Tiene muchos otros beneficios, como por ejemplo:  Mejorar el estado fsico, la flexibilidad y la resistencia.  Aumenta la densidad sea.  Ayuda a Technical sales engineer.  Disminuye la Air traffic controller.  Aumenta la fuerza muscular.  Reduce el estrs y las tensiones.  Mejora el estado de salud general. Las personas diabticas que realizan actividad fsica tienen beneficios adicionales debido al ejercicio:  Reduce el apetito.  El organismo mejora el uso del azcar (glucosa) de la [place].  Ayuda a disminuir o Product/process development scientist.  Disminuye la presin arterial.  Ayuda a disminuir los lpidos en la sangre (colesterol y triglicridos).  El organismo mejora el uso de la insulina porque:  Aumenta la sensibilidad del organismo a la insulina.  Reduce las necesidades de insulina del organismo.  Disminuye el riesgo de enfermedad cardaca por la actividad fsica ya que  disminuye el colesterol y [employer] triglicridos.  Aumenta los niveles de colesterol bueno (como las lipoprotenas de alta densidad [HDL]) en el organismo.  Disminuye los niveles de glucosa en la [place]. SU PLAN DE ACTIVIDAD  Elija una actividad que disfrute y establezca objetivos realistas. Para ejercitarse sin riesgos, debe comenzar a Psychologist, prison and probation services cualquier actividad fsica nueva lentamente y aumentar la intensidad del ejercicio de forma gradual con el tiempo. Su mdico o educador en diabetes podrn ayudarlo a crear un plan de actividades que lo beneficie.  Las recomendaciones generales incluyen lo siguiente:  Psychologist, clinical a los nios para que realicen al menos 60 minutos de actividad fsica Armed forces operational officer.  Estirarse y Optometrist ejercicios de entrenamiento de la fuerza, como yoga o levantamiento de pesas, por lo menos 2 veces por semana.  Realizar en total por lo menos 150 minutos de ejercicios de intensidad moderada cada semana, como caminar a paso ligero o hacer gimnasia acutica.  Hacer ejercicio fsico por lo menos 3 das por semana y no dejar pasar ms de 2 das seguidos sin ejercitarse.  Evitar los perodos largos de inactividad (90 minutos o ms tiempo). Cuando deba pasar mucho tiempo sentado, haga pausas frecuentes para caminar o estirarse. RECOMENDACIONES PARA REALIZAR EJERCICIOS CUANDO SE TIENE DIABETES TIPO 1 O TIPO 2   Controle la glucosa en la sangre antes de comenzar. Si el nivel de glucosa en la sangre es de ms de 240 mg/dl, controle las cetonas en la [place]. No haga actividad fsica si hay cetonas.  Evite inyectarse insulina en las zonas del cuerpo que ejercitar. Por ejemplo, evite inyectarse insulina en:  Los brazos, si juega al tenis.  Las piernas, si corre.  Lleve un registro de:  Los alimentos que consume antes y despus de TEFL teacher.  Los momentos esperables de picos de accin de la insulina.  Los niveles de glucosa en la sangre antes y despus de hacer ejercicios.  El tipo y cantidad de [place] fsica que Musician.  Revise los registros con su mdico. El mdico lo ayudar a Actor pautas para ajustar la cantidad de alimento y las cantidades de insulina antes y despus de Field seismologist ejercicios.  Si toma insulina o agentes hipoglucemiantes por va oral, observe si hay signos y sntomas de hipoglucemia. Entre los que se incluyen:  Mareos.  Temblores.  Sudoracin.  Escalofros.  Confusin.  Beba gran cantidad de agua mientras hace ejercicios para evitar la deshidratacin o los golpes de Freight forwarder. Durante la  actividad fsica se pierde agua corporal que se debe reponer.  Comente con su mdico antes de comenzar un programa de actividad fsica para verificar que sea seguro para usted. Recuerde, cualquier actividad es mejor que ninguna.   Esta informacin no tiene Marine scientist el consejo del mdico. Asegrese de hacerle al mdico cualquier pregunta que tenga.   Document Released: 03/27/2007 Document Revised: 07/22/2014 Elsevier Interactive Patient Education 2016 [place] (Health Maintenance, Female) Un estilo de vida saludable y los cuidados preventivos pueden favorecer considerablemente a la salud y Musician. Pregunte a su mdico cul es el cronograma de exmenes peridicos apropiado para usted. Esta es una buena oportunidad para consultarlo sobre cmo prevenir enfermedades y [place] sano. Adems de los controles, hay muchas otras cosas que puede hacer usted mismo. Los expertos han realizado numerosas investigaciones [employer] cambios en el estilo de vida y las medidas de prevencin que, [place], lo ayudarn a mantenerse sano. Solicite a su mdico ms informacin. EL PESO Y LA DIETA  Consuma una dieta saludable.  Asegrese de [employer] verduras, frutas, productos lcteos de bajo contenido de [place] y Advertising account planner.  No consuma muchos alimentos de alto contenido de grasas slidas, azcares agregados o sal.  Realice actividad fsica con regularidad. Esta es una de las prcticas ms importantes que puede hacer por su salud.  La mayora de los adultos deben hacer ejercicio durante al menos 122mnutos por semana. El ejercicio debe aumentar la frecuencia cardaca y pActorla transpiracin (ejercicio de iParks.  La mayora de los adultos tambin deben hField seismologistejercicios de elongacin al mToysRusveces a la semana. Agregue esto al su plan de ejercicio de intensidad moderada. Mantenga un peso saludable.  El  ndice de masa corporal ([facility] es una medida que puede utilizarse para identificar posibles problemas de pKings Point Proporciona una estimacin de la grasa corporal basndose en el peso y la altura. Su mdico puede ayudarle a dRadiation protection practitionerISaxmany a lScientist, forensico mTheatre managerun peso saludable.  Para las mujeres de 20aos o ms:  Un ICoosa Valley Medical Centermenor de 18,5 se considera bajo peso.  Un IWagner Community Memorial Hospitalentre 18,5 y 24,9 es normal.  Un IOrem Community Hospitalentre 25 y 29,9 se considera sobrepeso.  Un IMC de 30 o ms se considera obesidad. Observe los niveles de colesterol y lpidos en la sangre.  Debe comenzar a rEnglish as a second language teacherde lpidos y cResearch officer, trade unionen la sangre a los 20aos y luego repetirlos cada 553aos  Es posible que nAutomotive engineerlos niveles de colesterol con mayor frecuencia si:  Sus niveles de lpidos y colesterol son altos.  Es mayor de 50aos.  Presenta un alto riesgo de padecer enfermedades cardacas. DETECCIN DE CNCER  Cncer de pulmn  Se recomienda realizar exmenes de  deteccin de cncer de pulmn a personas adultas entre 80 y 21 aos que estn en riesgo de Horticulturist, commercial de pulmn por sus antecedentes de consumo de tabaco.  Se recomienda una tomografa computarizada de baja dosis de los [employer] aos a las personas que:  Fuman actualmente.  Hayan dejado el hbito en algn momento en los ltimos 15aos.  Hayan fumado durante 30aos un paquete diario. Un paquete-ao equivale a fumar un promedio de un paquete de cigarrillos diario durante un ao.  Los exmenes de deteccin anuales deben continuar hasta que hayan pasado 15aos desde que dej de fumar.  Ya no debern realizarse si tiene un problema de salud que le impida recibir tratamiento para Science writer de pulmn. Cncer de mama  Practique la autoconciencia de la mama. Esto significa reconocer la apariencia normal de sus mamas y cmo las siente.  Tambin significa realizar autoexmenes regulares de [employer]. Informe a su mdico sobre  cualquier cambio, sin importar cun pequeo sea.  Si tiene entre 20 y 62 aos, un mdico debe realizarle un examen clnico de las mamas como parte del examen regular de [place], cada 1 a 3aos.  Si tiene 40aos o ms, debe Information systems manager clnico de las Microsoft. Tambin considere realizarse una [place] todos los [place].  Si tiene antecedentes familiares de cncer de mama, hable con su mdico para someterse a un estudio gentico.  Si tiene alto riesgo de Chief Financial Officer de mama, hable con su mdico para someterse a Public house manager y [employer].  La evaluacin del gen del cncer de mama (BRCA) se recomienda a mujeres que tengan familiares con cnceres relacionados con el BRCA. Los cnceres relacionados con el BRCA incluyen los siguientes:  Mama.  Ovario.  Trompas.  Cnceres de peritoneo.  Los resultados de la evaluacin determinarn la necesidad de asesoramiento gentico y de [place] de BRCA1 y BRCA2. Cncer de cuello del tero El mdico puede recomendarle que se haga pruebas peridicas de deteccin de cncer de los rganos de la pelvis (ovarios, tero y vagina). Estas pruebas incluyen un examen plvico, que abarca controlar si se produjeron cambios microscpicos en la superficie del cuello del tero (prueba de Papanicolaou). Pueden recomendarle que se haga estas pruebas cada 3aos, a partir de los 21aos.  A las mujeres que tienen entre 30 y 55aos, los mdicos pueden recomendarles que se sometan a exmenes plvicos y pruebas de Papanicolaou cada 64aos, o a la prueba de Papanicolaou y el examen plvico en combinacin con estudios de deteccin del virus del papiloma humano (VPH) cada 5aos. Algunos tipos de VPH aumentan el riesgo de Chief Financial Officer de cuello del tero. La prueba para la deteccin del VPH tambin puede realizarse a mujeres de cualquier edad cuyos resultados de la prueba de Papanicolaou no sean  claros.  Es posible que otros mdicos no recomienden exmenes de deteccin a mujeres no embarazadas que se consideran sujetos de bajo riesgo de Chief Financial Officer de pelvis y que no tienen sntomas. Pregntele al mdico si un examen plvico de deteccin es adecuado para usted.  Si ha recibido un tratamiento para Science writer cervical o una enfermedad que podra causar cncer, necesitar realizarse una prueba de Papanicolaou y controles durante al menos 76 aos de concluido el Christmas. Si no se ha hecho el Papanicolaou con regularidad, debern volver a evaluarse los factores de riesgo (como tener un nuevo compaero sexual), para Teacher, adult education si debe realizarse los [employer]  nuevamente. Algunas mujeres sufren problemas mdicos que aumentan la probabilidad de Museum/gallery curator cncer de cuello del tero. En estos casos, el mdico podr [employer] se realicen controles y pruebas de Papanicolaou con ms frecuencia. Cncer colorrectal  Este tipo de cncer puede detectarse y a menudo prevenirse.  Por lo general, los estudios de rutina se deben Medical laboratory scientific officer a Field seismologist a Proofreader de los 32 aos y [place] 37 aos.  Sin embargo, el mdico podr aconsejarle que lo haga antes, si tiene factores de riesgo para el cncer de colon.  Tambin puede recomendarle que use un kit de prueba para Hydrologist en la materia fecal.  Es posible que se use una pequea cmara en el extremo de un tubo para examinar directamente el colon (sigmoidoscopia o colonoscopia) a fin de Hydrographic surveyor formas tempranas de cncer colorrectal.  Los exmenes de rutina generalmente comienzan a los 81aos.  El examen directo del colon se debe repetir cada 5 a 10aos hasta los 75aos. Sin embargo, es posible que se realicen exmenes con mayor frecuencia, si se detectan formas tempranas de plipos precancerosos o pequeos bultos. Cncer de piel  Revise la piel de la cabeza a los pies con regularidad.  Informe a su mdico si aparecen nuevos lunares o los  que tiene se modifican, especialmente en su forma y color.  Tambin notifique al mdico si tiene un lunar que es ms grande que el tamao de una goma de lpiz.  Siempre use pantalla solar. Aplique pantalla solar de [place] y repetida a lo largo del Training and development officer.  Protjase usando mangas y [employer], un sombrero de ala ancha y gafas para el sol, siempre que se encuentre en el exterior. ENFERMEDADES CARDACAS, DIABETES E HIPERTENSIN ARTERIAL   La hipertensin arterial causa enfermedades cardacas y [place] el riesgo de ictus. La hipertensin arterial es ms probable en los siguientes casos:  Las personas que tienen la presin arterial en el extremo del rango normal (100-139/85-89 mm Hg).  Las personas con sobrepeso u obesidad.  Las Retail banker.  Si usted tiene entre 18 y 39 aos, debe medirse la presin arterial cada 3 a 5 aos. Si usted tiene 40 aos o ms, debe medirse la presin arterial [employer]. Debe medirse la presin arterial dos veces: una vez cuando est en un hospital o una clnica y la otra vez cuando est en otro sitio. Registre el promedio de [employer]. Para controlar su presin arterial cuando no est en un hospital o [place], puede usar lo siguiente:  Ardelia Mems mquina automtica para medir la presin arterial en una farmacia.  Un monitor para medir la presin arterial en el hogar.  Si tiene entre 79 y 52 aos, consulte a su mdico si debe tomar aspirina para prevenir el ictus.  Realcese exmenes de deteccin de la diabetes con regularidad. Esto incluye la toma de [place] de sangre para controlar el nivel de azcar en la sangre durante el [place].  Si tiene un peso normal y un bajo riesgo de padecer diabetes, realcese este anlisis cada tres aos despus de los 45aos.  Si tiene sobrepeso y un alto riesgo de padecer diabetes, considere someterse a este anlisis antes o con mayor frecuencia. PREVENCIN DE INFECCIONES  HepatitisB  Si  tiene un riesgo ms alto de Museum/gallery curator hepatitis B, debe someterse a un examen de deteccin de este virus. Se considera que tiene un alto riesgo de Museum/gallery curator hepatitis B si:  Naci en un pas donde la hepatitis B  es frecuente. Pregntele a su mdico qu pases son considerados de Public affairs consultant.  Sus padres nacieron en un pas de alto riesgo y usted no recibi una vacuna que lo proteja contra la hepatitis B (vacuna contra la hepatitis B).  [place].  [place] agujas para inyectarse drogas.  Vive con alguien que tiene hepatitis B.  Ha tenido sexo con alguien que tiene hepatitis B.  Recibe tratamiento de hemodilisis.  Toma ciertos medicamentos para el cncer, trasplante de rganos y afecciones autoinmunitarias. Hepatitis C  Se recomienda un anlisis de [place] para:  Todos los que nacieron entre 1945 y [phone].  Todas las personas que tengan un riesgo de haber contrado hepatitis C. Enfermedades de transmisin sexual (ETS).  Debe realizarse pruebas de deteccin de enfermedades de transmisin sexual (ETS), incluidas gonorrea y clamidia si:  Es sexualmente activo y es menor de 24aos.  Es mayor de 24aos, y Investment banker, operational informa que corre riesgo de tener este tipo de infecciones.  La actividad sexual ha cambiado desde que le hicieron la ltima prueba de deteccin y tiene un riesgo mayor de Best boy clamidia o Radio broadcast assistant. Pregntele al mdico si usted tiene riesgo.  Si no tiene el VIH, pero corre riesgo de infectarse por el virus, se recomienda tomar diariamente un medicamento recetado para evitar la infeccin. Esto se conoce como profilaxis previa a la exposicin. Se considera que est en riesgo si:  Es [place] sexualmente y no [place] preservativos habitualmente o no conoce el estado del VIH de sus Advertising copywriter.  Se inyecta drogas.  Es [place] sexualmente con Ardelia Mems pareja que tiene VIH. Consulte a su mdico para saber si tiene un alto riesgo de infectarse por el VIH. Si opta por comenzar la  profilaxis previa a la exposicin, primero debe realizarse anlisis de deteccin del VIH. Luego, le harn anlisis cada 27mses mientras est tomando los medicamentos para la profilaxis previa a la exposicin.  [facility]  Si es premenopusica y puede quedar [place] solicite a su mdico asesoramiento previo a la concepcin.  Si puede quedar embarazada, tome 400 a [id number](mcg) de cido fAnheuser-Busch  Si desea evitar el embarazo, hable con su mdico sobre el control de la natalidad (anticoncepcin). OSTEOPOROSIS Y MENOPAUSIA   La osteoporosis es una enfermedad en la que los huesos pierden los minerales y la fuerza por el avance de la edad. El resultado pueden ser fracturas graves en los hBertram El riesgo de osteoporosis puede identificarse con uArdelia Memsprueba de densidad sea.  Si tiene 65aos o ms, o si est en riesgo de sufrir osteoporosis y fracturas, pregunte a su mdico si debe someterse a exmenes.  Consulte a su mdico si debe tomar un suplemento de calcio o de vitamina D para reducir el riesgo de osteoporosis.  La menopausia puede presentar ciertos sntomas fsicos y rGaffer  La terapia de reemplazo hormonal puede reducir algunos de estos sntomas y rGaffer Consulte a su mdico para saber si la terapia de reemplazo hormonal es conveniente para usted.  INSTRUCCIONES PARA EL CUIDADO EN EL HOGAR   Realcese los estudios de rutina de la salud, dentales y de lPublic librarian  [place]  No consuma ningn producto que contenga tabaco, lo que incluye cigarrillos, tabaco de mHigher education careers advisero cPsychologist, sport and exercise  Si est embarazada, no beba alcohol.  Si est amamantando, reduzca el consumo de alcohol y la frecuencia con la que consume.  Si es mujer y no est embarazada limite el consumo  de alcohol a no ms de 1 medida por da. Una medida equivale a 12onzas de cerveza, 5onzas de vino o 1onzas de bebidas alcohlicas de alta graduacin.  No consuma  drogas.  No comparta agujas.  Solicite ayuda a su mdico si necesita apoyo o informacin para abandonar las drogas.  Informe a su mdico si a menudo se siente deprimido.  Notifique a su mdico si alguna vez ha sido vctima de abuso o si no se siente seguro en su hogar.   Esta informacin no tiene Theme park manager el consejo del mdico. Asegrese de hacerle al mdico cualquier pregunta que tenga.   Document Released: 02/24/2011 Document Revised: 03/28/2014 Elsevier Interactive Patient Education 2016 [employer]. Influenza Virus Vaccine injection (Fluarix) Qu es este medicamento? La VACUNA ANTIGRIPAL ayuda a disminuir el riesgo de contraer la influenza, tambin conocida como la gripe. La vacuna solo ayuda a protegerle contra algunas cepas de influenza. Esta vacuna no ayuda a reducir Nurse, adult de contraer influenza pandmica H1N1. Este medicamento puede ser utilizado para otros usos; si tiene alguna pregunta consulte con su proveedor de atencin mdica o con su farmacutico. Qu le debo informar a mi profesional de la salud antes de tomar este medicamento? Necesita saber si usted presenta alguno de los siguientes problemas o situaciones: -trastorno de sangrado como hemofilia -fiebre o infeccin -sndrome de Guillain-Barre u otros problemas neurolgicos -problemas del sistema inmunolgico -infeccin por el virus de la inmunodeficiencia humana (VIH) o SIDA -niveles bajos de plaquetas en la sangre -esclerosis mltiple -una Automotive engineer o inusual a las vacunas antigripales, a los huevos, protenas de pollo, al ltex, a la gentamicina, a otros medicamentos, alimentos, colorantes o conservantes -si est embarazada o buscando quedar embarazada -si est amamantando a un beb Cmo debo utilizar este medicamento? Esta vacuna se administra mediante inyeccin por va intramuscular. Lo administra un profesional de [employer]. Recibir una copia de informacin escrita sobre la vacuna antes  de cada vacuna. Asegrese de leer este folleto cada vez cuidadosamente. Este folleto puede cambiar con frecuencia. Hable con su pediatra para informarse acerca del uso de este medicamento en nios. Puede requerir atencin especial. Sobredosis: Pngase en contacto inmediatamente con un centro toxicolgico o una sala de urgencia si usted cree que haya tomado demasiado medicamento. ATENCIN: [employer] es solo para usted. No comparta este medicamento con nadie. Qu sucede si me olvido de una dosis? No se aplica en este caso. Qu puede interactuar con este medicamento? -quimioterapia o radioterapia -medicamentos que suprimen el sistema inmunolgico, tales como etanercept, anakinra, infliximab y adalimumab -medicamentos que tratan o previenen cogulos sanguneos, como warfarina -fenitona -medicamentos esteroideos, como la prednisona o la cortisona -teofilina -vacunas Puede ser que esta lista no menciona todas las posibles interacciones. Informe a su profesional de [employer] de [employer] productos a base de hierbas, medicamentos de [place] o suplementos nutritivos que est tomando. Si usted fuma, consume bebidas alcohlicas o si utiliza drogas ilegales, indqueselo tambin a su profesional de [employer]. Algunas sustancias pueden interactuar con su medicamento. A qu debo estar atento al usar [employer]PL Corporation? Informe a su mdico o a Producer, television/film/video de la [employer] todos los efectos secundarios que persistan despus de [address]. Llame a su proveedor de atencin mdica si se presentan sntomas inusuales dentro de las 6 semanas posteriores a la vacunacin. Es posible que todava pueda contraer la gripe, pero la enfermedad no ser tan fuerte como normalmente. No puede contraer la gripe de esta vacuna. La vacuna  antigripal no le protege contra resfros u otras enfermedades que pueden causar [place]. Debe vacunarse cada ao. Qu efectos secundarios puedo tener al [employer] este medicamento? Efectos  secundarios que debe informar a su mdico o a Barrister's clerk de la salud tan pronto como sea posible: -reacciones alrgicas como erupcin cutnea, picazn o urticarias, hinchazn de la cara, labios o lengua Efectos secundarios que, por lo general, no requieren atencin mdica (debe informarlos a su mdico o a su profesional de la salud si persisten o si son molestos): -fiebre -dolor de cabeza -molestias y dolores musculares -dolor, sensibilidad, enrojecimiento o Estate agent de la inyeccin -cansancio o debilidad Puede ser que esta lista no menciona todos los posibles efectos secundarios. Comunquese a su mdico por asesoramiento mdico [employer]. Usted puede informar los efectos secundarios a la FDA por telfono al 1-800-FDA-1088. Dnde debo guardar mi medicina? Esta vacuna se administra solamente en clnicas, farmacias, consultorio mdico u otro consultorio de un profesional de la salud y no Sports coach en su domicilio. ATENCIN: Este folleto es un resumen. Puede ser que no cubra toda la posible informacin. Si usted tiene preguntas acerca de esta medicina, consulte con su mdico, su farmacutico o su profesional de Technical sales engineer.    2016, Elsevier/Gold Standard. (2009-09-08 15:31:40)

## 2015-11-24 NOTE — Progress Notes (Signed)
Sharon Richardson, is a 45 y.o. female  WUJ:[id number]  [id number]  DOB - 03-Apr-1970  CC:  Chief Complaint  Patient presents with  . URI       HPI: Sharon Richardson is a 45 y.o. female here today to establish medical care, last seen in clinic 5/17, now back for f/u. Per pt, her leg rash is better, and the rash on her left leg is also improving. Still using ketoconazole cream.  Per pt, about 12 days ago, she had worsening congestion/cough, no fevers.  Sx improving and almost resolved. Still w/ some nonproductive cough and nasal congestion.  No fevers/night sweats noted.  Does not smoke or drink etoh.  Patient has No headache, No chest pain, No abdominal pain - No Nausea, No new weakness tingling or numbness, No Cough - SOB.    Review of Systems: Per hPI, o/w all systems reviewed and negative.   No Known Allergies No past medical history on file. Current Outpatient Prescriptions on File Prior to Visit  Medication Sig Dispense Refill  . ferrous sulfate 325 (65 FE) MG tablet Take 1 tablet (325 mg total) by mouth daily with breakfast. Reported on 08/06/2015 90 tablet 3  . hydrOXYzine (ATARAX/VISTARIL) 25 MG tablet Take 1 tablet (25 mg total) by mouth every evening. For anxiety (Patient not taking: Reported on 08/06/2015) 30 tablet 2  . ketoconazole (NIZORAL) 2 % cream Apply 1 application topically daily. X 4 weeks (Patient not taking: Reported on 11/24/2015) 60 g 0   No current facility-administered medications on file prior to visit.    Family History  Problem Relation Age of Onset  . Diabetes Mother   . Hypertension Father    Social History   Social History  . Marital status: Married    Spouse name: N/A  . Number of children: N/A  . Years of education: N/A   Occupational History  . Not on file.   Social History Main Topics  . Smoking status: Never Smoker  . Smokeless tobacco: Never Used  . Alcohol use No  . Drug use: No  . Sexual activity:  Not on file   Other Topics Concern  . Not on file   Social History Narrative  . No narrative on file    Objective:   Vitals:   11/24/15 1526  BP: 121/80  Pulse: 72  Resp: 16  Temp: 98.3 F (36.8 C)    Filed Weights   11/24/15 1526  Weight: 163 lb 12.8 oz (74.3 kg)    BP Readings from Last 3 Encounters:  11/24/15 121/80  08/06/15 129/81  04/08/15 (!) 142/86    Physical Exam: Constitutional: Patient appears well-developed and well-nourished. No distress. AAOx3 HENT: Normocephalic, atraumatic, External right and left ear normal. Oropharynx is clear and moist. bilat ear canals w/ some ceruminosis, but TMs care clear.  No frontal/max sinus ttp. But some congestion bilat nares noted. No cobblestoning in oralpharyx Eyes: Conjunctivae and EOM are normal. PERRL, no scleral icterus. Neck: Normal ROM. Neck supple. No JVD.  No thyromegaly. CVS: RRR, S1/S2 +, no murmurs, no gallops, no carotid bruit.  Pulmonary: Effort and breath sounds normal, no stridor, rhonchi, wheezes, rales.  Abdominal: Soft. BS +, no distension, tenderness, rebound or guarding.  Musculoskeletal: Normal range of motion. No edema and no tenderness.  LE: bilat/ no c/c/e, pulses 2+ bilateral.  Onychomycosis on left great toe, also some mild scaling noted on the lateral aspect of left foot. Neuro: Alert. muscle tone coordination wnl. No cranial  nerve deficit grossly. Skin: Skin is warm and dry. No rash noted. Not diaphoretic. No erythema. No pallor. Psychiatric: Normal mood and affect. Behavior, judgment, thought content normal.  Lab Results  Component Value Date   WBC 5.7 08/06/2015   HGB 11.3 (L) 08/06/2015   HCT 36.9 08/06/2015   MCV 78.2 (L) 08/06/2015   PLT 228 08/06/2015   Lab Results  Component Value Date   CREATININE 0.54 08/06/2015   BUN 15 08/06/2015   NA 136 08/06/2015   K 4.5 08/06/2015   CL 104 08/06/2015   CO2 24 08/06/2015    Lab Results  Component Value Date   HGBA1C 5.6  08/06/2015   Lipid Panel     Component Value Date/Time   CHOL 166 12/26/2014 0910   TRIG 71 12/26/2014 0910   HDL 54 12/26/2014 0910   CHOLHDL 3.1 12/26/2014 0910   VLDL 14 12/26/2014 0910   LDLCALC 98 12/26/2014 0910       Depression screen PHQ 2/9 11/24/2015 08/06/2015 04/08/2015 09/24/2013  Decreased Interest 1 1 2  0  Down, Depressed, Hopeless 1 0 1 0  PHQ - 2 Score 2 1 3  0  Altered sleeping 1 2 3  -  Tired, decreased energy 1 1 2  -  Change in appetite 1 0 1 -  Feeling bad or failure about yourself  0 0 0 -  Trouble concentrating 0 1 2 -  Moving slowly or fidgety/restless 0 0 1 -  Suicidal thoughts 0 0 0 -  PHQ-9 Score 5 5 12  -  Difficult doing work/chores - Not difficult at all - -    Assessment and plan:   1. Bilateral impacted cerumen Debrox bilat  2. Obesity Recd increase exercise, decrease carbs, info on diabetes diet provided, pt is high risk for developing dm, suspect metabolic syndrome.   3. Flu vaccine need - no contraindication for vaccine. Afebrile, no active infections noted. - Flu Vaccine QUAD 36+ mos PF IM (Fluarix & Fluzone Quad PF)  4. Allergic rhinitis, unspecified allergic rhinitis type - flonase bid - suspect pt had viral uri recently.   Return in about 3 months (around 02/23/2016).  The patient was given clear instructions to go to ER or return to medical center if symptoms don't improve, worsen or new problems develop. The patient verbalized understanding. The patient was told to call to get lab results if they haven't heard anything in the next week.    This note has been created with Education officer, environmentalDragon speech recognition software and smart phrase technology. Any transcriptional errors are unintentional.   Pete Glatterawn T Odie Rauen, MD, MBA/MHA [facility] [place], [place] [phone]   11/24/2015, 3:50 PM

## 2016-01-13 ENCOUNTER — Ambulatory Visit: Payer: PRIVATE HEALTH INSURANCE | Attending: Internal Medicine | Admitting: Internal Medicine

## 2016-01-13 ENCOUNTER — Encounter: Payer: Self-pay | Admitting: Internal Medicine

## 2016-01-13 VITALS — BP 118/78 | HR 66 | Temp 98.2°F | Resp 16 | Wt 163.2 lb

## 2016-01-13 DIAGNOSIS — R200 Anesthesia of skin: Secondary | ICD-10-CM

## 2016-01-13 DIAGNOSIS — G5711 Meralgia paresthetica, right lower limb: Secondary | ICD-10-CM | POA: Insufficient documentation

## 2016-01-13 DIAGNOSIS — R202 Paresthesia of skin: Secondary | ICD-10-CM

## 2016-01-13 DIAGNOSIS — I83893 Varicose veins of bilateral lower extremities with other complications: Secondary | ICD-10-CM | POA: Insufficient documentation

## 2016-01-13 DIAGNOSIS — D509 Iron deficiency anemia, unspecified: Secondary | ICD-10-CM | POA: Insufficient documentation

## 2016-01-13 LAB — IRON,TIBC AND FERRITIN PANEL
%SAT: 19 % (ref 11–50)
FERRITIN: 17 ng/mL (ref 10–232)
Iron: 79 ug/dL (ref 40–190)
TIBC: 425 ug/dL (ref 250–450)

## 2016-01-13 LAB — VITAMIN B12: Vitamin B-12: 693 pg/mL (ref 200–1100)

## 2016-01-13 LAB — CBC WITH DIFFERENTIAL/PLATELET
Basophils Absolute: 0 cells/uL (ref 0–200)
Basophils Relative: 0 %
Eosinophils Absolute: 98 cells/uL (ref 15–500)
Eosinophils Relative: 2 %
HCT: 42.2 % (ref 35.0–45.0)
HEMOGLOBIN: 13.6 g/dL (ref 11.7–15.5)
LYMPHS ABS: 1666 {cells}/uL (ref 850–3900)
Lymphocytes Relative: 34 %
MCH: 28.6 pg (ref 27.0–33.0)
MCHC: 32.2 g/dL (ref 32.0–36.0)
MCV: 88.7 fL (ref 80.0–100.0)
MPV: 11.1 fL (ref 7.5–12.5)
Monocytes Absolute: 245 cells/uL (ref 200–950)
Monocytes Relative: 5 %
NEUTROS ABS: 2891 {cells}/uL (ref 1500–7800)
NEUTROS PCT: 59 %
Platelets: 205 10*3/uL (ref 140–400)
RBC: 4.76 MIL/uL (ref 3.80–5.10)
RDW: 14.1 % (ref 11.0–15.0)
WBC: 4.9 10*3/uL (ref 3.8–10.8)

## 2016-01-13 LAB — FOLATE: FOLATE: 18.4 ng/mL (ref >5.4)

## 2016-01-13 NOTE — Patient Instructions (Signed)
Wear loose clothing 2 wks Compression stockings during day  Venas varicosas (Varicose Veins) Las venas varicosas son venas que se han agrandado y tornado sinuosas. Suelen aparecer Cox Communicationsen las piernas, pero tambin pueden verse en otra parte del cuerpo. CAUSAS Esta afeccin se presenta como consecuencia del mal funcionamiento de las vlvulas de las venas, las cuales ayudan al retorno de la sangre desde las piernas hacia el corazn. Si estas vlvulas se daan, la sangre retrocede y regresa a las venas de la pierna, cerca de la superficie de la piel, lo que causa dilatacin venosa. FACTORES DE RIESGO Las personas que estn mucho tiempo paradas, las embarazadas o las personas con sobrepeso tienen ms probabilidades de tener venas varicosas. SIGNOS Y SNTOMAS  Venas abultadas, azuladas y de aspecto sinuoso que se observan con mayor frecuencia en las piernas.  Dolor o sensacin de Development worker, communitypesadez en las piernas. Estos sntomas pueden empeorar al final del da.  Hinchazn de las piernas.  Cambios en el color de la piel. DIAGNSTICO Generalmente, el mdico puede diagnosticar las venas varicosas al examinarle las piernas. Adems, puede recomendarle que se haga una ecografa de las venas de las piernas. TRATAMIENTO La mayora de las venas varicosas pueden tratarse en casa. Sin embargo, hay otros tratamientos a disposicin de McGraw-Hilllas personas que tienen sntomas persistentes o desean mejorar la apariencia esttica de las venas varicosas. Estas opciones de tratamiento incluyen lo siguiente:  Escleroterapia. Se inyecta una solucin en la vena para anularla.  Tratamiento con lser. Se [place]a un rayo lser para calentar la vena y anularla.  Ablacin venosa por radiofrecuencia Se Botswanausa una corriente elctrica que se produce mediante ondas de radio para anular la vena.  Flebectoma. Se extirpa quirrgicamente la vena a travs de pequeas incisiones que se hacen sobre la vena varicosa.  Ligadura venosa y varicectoma. La  vena se extirpa quirrgicamente a travs de incisiones que se realizan sobre la vena varicosa despus de haberla anudado (ligado). INSTRUCCIONES PARA EL CUIDADO EN EL HOGAR  No permanezca sentado o de pie en una posicin durante mucho tiempo. No se siente con las piernas cruzadas. Descanse con las piernas Radiation protection practitionerelevadas durante el da.  Use medias de compresin como le haya indicado su mdico. Estas medias ayudan a evitar la formacin de cogulos sanguneos y a Building services engineerreducir la hinchazn de las piernas.  No use otras prendas que le ajusten todo el contorno de las piernas, la pelvis o la cintura.  Camine todo lo posible para aumentar la circulacin de Risk managerla sangre.  A la noche, eleve el pie de la cama con bloques de 2pulgadas.  Si tiene un corte en la piel sobre la vena y la vena sangra, recustese con la pierna elevada y Colombiaejerza presin en el lugar con un pao limpio, hasta que deje de Geophysicist/field seismologistsangrar. Luego aplique un apsito (vendaje) sobre el corte. Consulte al mdico si el sangrado contina. SOLICITE ATENCIN MDICA SI:  La piel alrededor del tobillo empieza a Lobbyistagrietarse.  Siente dolor, hay enrojecimiento, sensibilidad o hinchazn dura en la pierna sobre una vena.  Est incmodo debido al dolor de la pierna.   Esta informacin no tiene Theme park managercomo fin reemplazar el consejo del mdico. Asegrese de hacerle al mdico cualquier pregunta que tenga.   Document Released: 12/15/2004 Document Revised: 03/28/2014 Elsevier Interactive Patient Education 2016 Elsevier Inc.  - meralgia paresthetica - nerve impingement

## 2016-01-13 NOTE — Progress Notes (Signed)
Pt is in the office today for numbness and pain in right leg Pt states she wouldn't really say pain Pt states the numbness has been going on for 3 weeks

## 2016-01-13 NOTE — Progress Notes (Signed)
Sharon Richardson, is a 45 y.o. female  ZOX:[id number]  [id number]:[id number]  DOB - [birth year]  Chief Complaint  Patient presents with  . Numbness        Subjective:   Sharon Richardson is a 45 y.o. female here today for a follow up visit., last seen in clinic 11/24/15.  Pt c/o of numbness and tingling in the last 2 digits of her right foot that extends the lateral part of her thigh. She states this is occurring now more so when she is standing for long periods of time, denies pain though. Denies wearing tight clothes. No dizziness. Denies back pain unless she is sitting in car for long periods.  Patient has No headache, No chest pain, No abdominal pain - No Nausea, No new weakness tingling or numbness, No Cough - SOB.  No problems updated.  ALLERGIES: No Known Allergies  PAST MEDICAL HISTORY: No past medical history on file.  MEDICATIONS AT HOME: Prior to Admission medications   Medication Sig Start Date End Date Taking? Authorizing Provider  ferrous sulfate 325 (65 FE) MG tablet Take 1 tablet (325 mg total) by mouth daily with breakfast. Reported on 08/06/2015 08/07/15  Yes Marzella Schlein McClung, PA-C  carbamide peroxide (DEBROX) 6.5 % otic solution Place 5 drops into both ears 2 (two) times daily. Patient not taking: Reported on 01/13/2016 11/24/15   Pete Glatter, MD  fluticasone [facility]) 50 MCG/ACT nasal spray Place 2 sprays into both nostrils daily. Patient not taking: Reported on 01/13/2016 11/24/15   Pete Glatter, MD  hydrOXYzine (ATARAX/VISTARIL) 25 MG tablet Take 1 tablet (25 mg total) by mouth every evening. For anxiety Patient not taking: Reported on 01/13/2016 04/08/15   Ambrose Finland, NP  ketoconazole (NIZORAL) 2 % cream Apply 1 application topically daily. X 4 weeks Patient not taking: Reported on 01/13/2016 08/06/15   Anders Simmonds, PA-C     Objective:   Vitals:   01/13/16 1120  BP: 118/78  Pulse: 66  Resp: 16  Temp: 98.2 F (36.8 C)    TempSrc: Oral  SpO2: 98%  Weight: 163 lb 3.2 oz (74 kg)    Exam General appearance : Awake, alert, not in any distress. Speech Clear. Not toxic looking, pleasant. HEENT: Atraumatic and Normocephalic, pupils equally reactive to light. Neck: supple, no JVD. No cervical lymphadenopathy.  Chest:Good air entry bilaterally, no added sounds. CVS: S1 S2 regular, no murmurs/gallups or rubs. Abdomen: Bowel sounds active, Non tender and not distended with no gaurding, rigidity or rebound. Extremities: after taking off pants (which are tight jeans), noted severe varicose veins bilat feet.  Pt w/ reduced sensation on lateral part of foot ttat extends a bit on lateral thigh.  No pain/no numbness on my exam. No back pain on straight leg test noted.  Trace edema lower extrem bilat Neurology: Awake alert, and oriented X 3, CN II-XII grossly intact, Non focal Skin:No Rash  Data Review Lab Results  Component Value Date   HGBA1C 5.6 08/06/2015   HGBA1C 5.6 10/01/2013   HGBA1C 5.6 06/26/2009    Depression screen PHQ 2/9 01/13/2016 11/24/2015 08/06/2015 04/08/2015 09/24/2013  Decreased Interest 0 1 1 2  0  Down, Depressed, Hopeless 0 1 0 1 0  PHQ - 2 Score 0 2 1 3  0  Altered sleeping 2 1 2 3  -  Tired, decreased energy 1 1 1 2  -  Change in appetite 0 1 0 1 -  Feeling bad or failure about yourself  0 0  0 0 -  Trouble concentrating 0 0 1 2 -  Moving slowly or fidgety/restless 0 0 0 1 -  Suicidal thoughts 0 0 0 0 -  PHQ-9 Score 3 5 5 12  -  Difficult doing work/chores - - Not difficult at all - -      Assessment & Plan   1. Numbness and tingling of right leg, suspect periph nerve phenomenon, but may be component of lat cutenaous nerve impingement too. - recd trial of wearing looser pants for next 2 wks and compression stockings - compression stockings rx., instructed not to wear while sleeping. - ro blood dyscrasias, hx of iron def anemia  2. Meralgia paraesthetica, right ?? Although does not have  typical pain.  3. Varicose veins of both legs with edema Compression stockings.  4. Iron deficiency anemia, unspecified iron deficiency anemia type - CBC with Differential - Iron, TIBC and Ferritin Panel - Folate - Vitamin B12  5. Language barrier Interpreter was used to communicate directly with patient for the entire encounter including providing detailed patient instructions.    Patient have been counseled extensively about nutrition and exercise  Return in about 3 weeks (around 02/03/2016) for fu on sx, nerve pain.  The patient was given clear instructions to go to ER or return to medical center if symptoms don't improve, worsen or new problems develop. The patient verbalized understanding. The patient was told to call to get lab results if they haven't heard anything in the next week.   This note has been created with Education officer, environmentalDragon speech recognition software and smart phrase technology. Any transcriptional errors are unintentional.   Pete Glatterawn T Domingo Fuson, MD, MBA/MHA [facility] and [facility] [place], [place] [phone]   01/13/2016, 12:49 PM

## 2016-01-29 ENCOUNTER — Telehealth: Payer: Self-pay

## 2016-01-29 ENCOUNTER — Telehealth: Payer: Self-pay | Admitting: Internal Medicine

## 2016-01-29 NOTE — Telephone Encounter (Signed)
Pt. Returned call. Please f/u °

## 2016-01-29 NOTE — Telephone Encounter (Signed)
[employer] Reuel BoomDaniel Id: [id number] contacted pt to go over lab results pt didn't answer lvm asking pt to give me a call at her earliest convenience

## 2016-01-29 NOTE — Telephone Encounter (Signed)
[employer] BrentwoodLisette Id: [id number] returned pt call to go over lab results pt is aware of lab results and doesn't have any questions or concerns

## 2016-08-04 ENCOUNTER — Encounter: Payer: Self-pay | Admitting: Internal Medicine

## 2016-08-05 ENCOUNTER — Encounter: Payer: Self-pay | Admitting: Internal Medicine

## 2016-08-08 ENCOUNTER — Encounter: Payer: Self-pay | Admitting: Internal Medicine

## 2016-10-13 ENCOUNTER — Encounter: Payer: Self-pay | Admitting: Family Medicine

## 2016-10-13 ENCOUNTER — Ambulatory Visit: Payer: Self-pay | Attending: Family Medicine | Admitting: Family Medicine

## 2016-10-13 VITALS — BP 108/71 | HR 69 | Temp 98.0°F | Resp 18 | Ht 60.0 in | Wt 169.0 lb

## 2016-10-13 DIAGNOSIS — N898 Other specified noninflammatory disorders of vagina: Secondary | ICD-10-CM | POA: Insufficient documentation

## 2016-10-13 DIAGNOSIS — B351 Tinea unguium: Secondary | ICD-10-CM | POA: Insufficient documentation

## 2016-10-13 DIAGNOSIS — L84 Corns and callosities: Secondary | ICD-10-CM | POA: Insufficient documentation

## 2016-10-13 DIAGNOSIS — Z113 Encounter for screening for infections with a predominantly sexual mode of transmission: Secondary | ICD-10-CM | POA: Insufficient documentation

## 2016-10-13 DIAGNOSIS — Z01419 Encounter for gynecological examination (general) (routine) without abnormal findings: Secondary | ICD-10-CM | POA: Insufficient documentation

## 2016-10-13 MED ORDER — METRONIDAZOLE 500 MG PO TABS: 500.0000 mg | ORAL_TABLET | Freq: Two times a day (BID) | ORAL | 0 refills | Status: DC

## 2016-10-13 MED FILL — metroNIDAZOLE 500 MG TABS: 500 | 7 days supply | Qty: 14 | Fill #0

## 2016-10-13 NOTE — Progress Notes (Signed)
Subjective:  Patient ID: Sharon Richardson, female    DOB: Aug 27, 1970  Age: 46 y.o. MRN: [id number]  CC: Gynecologic Exam  HPI Sharon Richardson presents for well woman visit with gynecological examination. She denies any family history or breast or gynecological cancers. She denies any lumps,nipple discharge, denting or dimpling of the breast. She does not perform monthly SBE. She is a non-smoker.She requestsSTI testing with examination. She reports vaginal discharge, malodorous after menstrual cycle. She denies any lesions or dysuria. Reports no sexual partner within the last 3 months. She has nexplanon implant in place. Request to have nexplanon implant removed. Reports history or irregular menstrual cycles occurring every two to three months. She also complains of abnormal appearing toenails.  Symptoms have been ongoing for about several years, and include increasing thickness, yellow toenail discoloration. Previous treatment has included none . Known liver disease? no.    Outpatient Medications Prior to Visit  Medication Sig Dispense Refill  . carbamide peroxide (DEBROX) 6.5 % otic solution Place 5 drops into both ears 2 (two) times daily. (Patient not taking: Reported on 01/13/2016) 15 mL 0  . ferrous sulfate 325 (65 FE) MG tablet Take 1 tablet (325 mg total) by mouth daily with breakfast. Reported on 08/06/2015 90 tablet 3  . fluticasone (FLONASE) 50 MCG/ACT nasal spray Place 2 sprays into both nostrils daily. (Patient not taking: Reported on 01/13/2016) 16 g 6  . hydrOXYzine (ATARAX/VISTARIL) 25 MG tablet Take 1 tablet (25 mg total) by mouth every evening. For anxiety (Patient not taking: Reported on 01/13/2016) 30 tablet 2  . ketoconazole (NIZORAL) 2 % cream Apply 1 application topically daily. X 4 weeks (Patient not taking: Reported on 01/13/2016) 60 g 0   No facility-administered medications prior to visit.     ROS Review of Systems  Constitutional: Negative.     Respiratory: Negative.   Cardiovascular: Negative.   Gastrointestinal: Negative.   Genitourinary: Negative.   Skin:       Toenail fungus   Psychiatric/Behavioral: Negative.     Objective:  BP 108/71 (BP Location: Left Arm, Patient Position: Sitting, Cuff Size: Normal)   Pulse 69   Temp 98 F (36.7 C) (Oral)   Resp 18   Ht 5' (1.524 m)   Wt 169 lb (76.7 kg)   SpO2 99%   BMI 33.01 kg/m   BP/Weight 10/13/2016 01/13/2016 11/24/2015  Systolic BP 108 118 121  Diastolic BP 71 78 80  Wt. (Lbs) 169 163.2 163.8  BMI 33.01 31.87 31.99   Physical Exam  Constitutional: She appears well-developed and well-nourished.  Eyes: Pupils are equal, round, and reactive to light. Conjunctivae are normal.  Cardiovascular: Normal rate, regular rhythm, normal heart sounds and intact distal pulses.   Pulmonary/Chest: Effort normal and breath sounds normal. Right breast exhibits no mass, no nipple discharge, no skin change and no tenderness. Left breast exhibits no mass, no nipple discharge, no skin change and no tenderness.  Abdominal: Soft. Bowel sounds are normal.  Genitourinary: Cervix exhibits discharge (white, thin). No vaginal discharge found.  Skin: Skin is warm and dry.  Thickened, yellow, bilateral nail discoloration.   Psychiatric: She has a normal mood and affect.  Nursing note and vitals reviewed.   Assessment & Plan:   Problem List Items Addressed This Visit    None    Visit Diagnoses    Well woman exam with routine gynecological exam    -  Primary   Relevant Orders   Cytology - PAP [place] (  Completed)   Vaginal discharge       Relevant Medications   metroNIDAZOLE (FLAGYL) 500 MG tablet   Onychomycosis           Pending labs will presribed lamisil     Repeat CBC and Hepatic labs in 1 mth.   Relevant Medications   metroNIDAZOLE (FLAGYL) 500 MG tablet   Other Relevant Orders   Ambulatory referral to Podiatry   CBC (Completed)   Hepatic Function Panel (Completed)   Foot  callus       Relevant Orders   Ambulatory referral to Podiatry   Screening for STDs (sexually transmitted diseases)       Relevant Orders   Cytology - PAP [place] (Completed)   HEP, RPR, HIV Panel (Completed)      Meds ordered this encounter  Medications  . metroNIDAZOLE (FLAGYL) 500 MG tablet    Sig: Take 1 tablet (500 mg total) by mouth 2 (two) times daily.    Dispense:  14 tablet    Refill:  0    Order Specific Question:   Supervising Provider    Answer:   Quentin AngstJEGEDE, OLUGBEMIGA E [id number]]    Follow-up: Return As needed.   Lizbeth BarkMandesia R Arriel Victor FNP

## 2016-10-13 NOTE — Progress Notes (Signed)
Patient is here for possible fungus on left foot

## 2016-10-13 NOTE — Patient Instructions (Addendum)
Breast Self-Awareness Breast self-awareness means:  Knowing how your breasts look.  Knowing how your breasts feel.  Checking your breasts every month for changes.  Telling your doctor if you notice a change in your breasts.  Breast self-awareness allows you to notice a breast problem early while it is still small. How to do a breast self-exam One way to learn what is normal for your breasts and to check for changes is to do a breast self-exam. To do a breast self-exam: Look for Changes  1. Take off all the clothes above your waist. 2. Stand in front of a mirror in a room with good lighting. 3. Put your hands on your hips. 4. Push your hands down. 5. Look at your breasts and nipples in the mirror to see if one breast or nipple looks different than the other. Check to see if: ? The shape of one breast is different. ? The size of one breast is different. ? There are wrinkles, dips, and bumps in one breast and not the other. 6. Look at each breast for changes in your skin, such as: ? Redness. ? Scaly areas. 7. Look for changes in your nipples, such as: ? Liquid around the nipples. ? Bleeding. ? Dimpling. ? Redness. ? A change in where the nipples are. Feel for Changes 1. Lie on your back on the floor. 2. Feel each breast. To do this, follow these steps: ? Pick a breast to feel. ? Put the arm closest to that breast above your head. ? Use your other arm to feel the nipple area of your breast. Feel the area with the pads of your three middle fingers by making small circles with your fingers. For the first circle, press lightly. For the second circle, press harder. For the third circle, press even harder. ? Keep making circles with your fingers at the light, harder, and even harder pressures as you move down your breast. Stop when you feel your ribs. ? Move your fingers a little toward the center of your body. ? Start making circles with your fingers again, this time going up until  you reach your collarbone. ? Keep making up and down circles until you reach your armpit. Remember to keep using the three pressures. ? Feel the other breast in the same way. 3. Sit or stand in the shower or tub. 4. With soapy water on your skin, feel each breast the same way you did in step 2, when you were lying on the floor. Write Down What You Find  After doing the self-exam, write down:  What is normal for each breast.  Any changes you find in each breast.  When you last had your period.  How often should I check my breasts? Check your breasts every month. If you are breastfeeding, the best time to check them is after you feed your baby or after you use a breast pump. If you get periods, the best time to check your breasts is 5-7 days after your period is over. When should I see my doctor? See your doctor if you notice:  A change in shape or size of your breasts or nipples.  A change in the skin of your breast or nipples, such as red or scaly skin.  Unusual fluid coming from your nipples.  A lump or thick area that was not there before.  Pain in your breasts.  Anything that concerns you.  This information is not intended to replace advice given to   you by your health care provider. Make sure you discuss any questions you have with your health care provider. Document Released: 08/24/2007 Document Revised: 08/13/2015 Document Reviewed: 01/25/2015 Elsevier Interactive Patient Education  2018 [employer]   Pap Test Why am I having this test? A pap test is sometimes called a pap smear. It is a screening test that is used to check for signs of cancer of the vagina, cervix, and uterus. The test can also identify the presence of infection or precancerous changes. Your health care provider will likely recommend you have this test done on a regular basis. This test may be done:  Every 3 years, starting at age 46.  Every 5 years, in combination with testing for the presence of  human papillomavirus (HPV).  More or less often depending on other medical conditions.  What kind of sample is taken? Using a small cotton swab, plastic spatula, or brush, your health care provider will collect a sample of cells from the surface of your cervix. Your cervix is the opening to your uterus, also called a womb. Secretions from the cervix and vagina may also be collected. How do I prepare for this test?  Be aware of where you are in your menstrual cycle. You may be asked to reschedule the test if you are menstruating on the day of the test.  You may need to reschedule if you have a known vaginal infection on the day of the test.  You may be asked to avoid douching or taking a bath the day before or the day of the test.  Some medicines can cause abnormal test results, such as digitalis and tetracycline. Talk with your health care provider before your test if you take one of these medicines. What do the results mean? Abnormal test results may indicate a number of health conditions. These may include:  Cancer. Although pap test results cannot be used to diagnose cancer of the cervix, vagina, or uterus, they may suggest the possibility of cancer. Further tests would be required to determine if cancer is present.  Sexually transmitted disease.  Fungal infection.  Parasite infection.  Herpes infection.  A condition causing or contributing to infertility.  It is your responsibility to obtain your test results. Ask the lab or department performing the test when and how you will get your results. Contact your health care provider to discuss any questions you have about your results. Talk with your health care provider to discuss your results, treatment options, and if necessary, the need for more tests. Talk with your health care provider if you have any questions about your results. This information is not intended to replace advice given to you by your health care provider. Make  sure you discuss any questions you have with your health care provider. Document Released: 05/28/2002 Document Revised: 11/11/2015 Document Reviewed: 07/29/2013 Elsevier Interactive Patient Education  2018 Elsevier Inc. Infeccin por hongos en las uas (Fungal Nail Infection) La infeccin por hongos en las uas es una infeccin por hongos frecuente en las uas de los pies o de las manos. Este trastorno Coca Colaafecta las uas de los pies con ms frecuencia que las uas de las manos. Ms de Neomia Dearuna ua puede infectarse. Esta afeccin puede transmitirse de [place] persona a otra (es  contagiosa). CAUSAS La causa de esta afeccin es un hongo. Existen distintos tipos de hongos que pueden causar la infeccin. Estos hongos son ms frecuentes en las zonas hmedas y clidas. Si las manos o los pies  entran en contacto con los hongos, se pueden introducir en una ruptura de las uas de las manos o de los pies y Games developerproducir la infeccin. FACTORES DE RIESGO Los siguientes factores pueden hacer que usted sea propenso a sufrir esta afeccin:  Ser varn.  Tener diabetes.  Ser Neomia Dearuna persona de edad avanzada.  Convivir con alguien que tiene hongos.  Caminar descalzo en zonas donde proliferan hongos, como duchas o vestuarios.  Tener mala circulacin.  Usar zapatos y calcetines que Visteon Corporationhacen transpirar los pies.  Tener pie de atleta.  Tener una ua lastimada o antecedentes recientes de una ciruga de uas.  Tener psoriasis.  Debilitamiento del sistema de defensa del cuerpo (sistema inmunitario). SNTOMAS Los sntomas de esta afeccin incluyen lo siguiente:  Cyndia DiverUna mancha plida sobre la ua.  Engrosamiento de la ua.  Una ua que se torna amarilla o [place]n.  Bordes de las uas rugosos o quebradizos.  Una ua que se cae.  Una ua que se ha desprendido del lecho ungueal. DIAGNSTICO Esta afeccin se diagnostica mediante un examen fsico. El mdico podr tomar una muestra de la ua para examinarla y Engineer, manufacturingdetectar si tiene  hongos. TRATAMIENTO Las infecciones leves no necesitan tratamiento. Si tiene Charter Communicationscambios importantes en las uas, el tratamiento puede incluir lo siguiente:  Medicamentos antimicticos por va oral. Deber tomar los medicamentos durante algunas semanas o meses y no ver los resultados hasta despus de un largo Sheridantiempo. Estos medicamentos pueden tener efectos secundarios. Consulte al Dow Chemicalmdico sobre los problemas a los que debe estar atento.  Cremas y esmaltes para uas antimicticos. Se pueden usar junto con los medicamentos antimicticos que se administran por va oral.  Tratamiento lser de las uas.  Ciruga para extirpar la ua. Esto puede ser Foot Lockernecesario en los casos ms graves de infecciones. El tratamiento es muy largo y la infeccin Metallurgistpuede reaparecer. INSTRUCCIONES PARA EL CUIDADO EN EL HOGAR Medicamentos  Tome o aplquese los medicamentos de venta libre y Science writerrecetados solamente como se lo haya indicado el mdico.  Consulte al mdico sobre el uso de pomadas mentoladas para las uas de Imlayventa libre. Estilo de vida  No comparta elementos personales como toallas o cortauas.  Crtese las uas con frecuencia.  Lvese y squese las manos y los pies todos [place]los das.  Use calcetines absorbentes y cmbiese los calcetines con frecuencia.  Use un tipo de calzado que permita que el aire Gerrardcircule, como sandalias o zapatillas de lona. Deseche los zapatos viejos.  Use guantes de goma si est trabajando con sus manos en lugares mojados.  No camine descalzo en duchas o vestuarios.  No concurra a un saln de cosmtica de uas si no usan instrumentos limpios.  No use uas artificiales. Instrucciones generales  Concurra a todas las visitas de control como se lo haya indicado el mdico. Esto es importante.  Aplquese polvo antimictico en los pies y en los zapatos. SOLICITE ATENCIN MDICA SI: La infeccin no mejora o si empeora despus de varios meses. Esta informacin no tiene Theme park managercomo fin reemplazar el  consejo del mdico. Asegrese de hacerle al mdico cualquier pregunta que tenga. Document Released: 12/15/2004 Document Revised: 06/29/2015 Document Reviewed: 09/08/2014 Elsevier Interactive Patient Education  [employer]2018 Elsevier Inc.

## 2016-10-14 LAB — HEP, RPR, HIV PANEL
HEP B S AG: NEGATIVE
HIV SCREEN 4TH GENERATION: NONREACTIVE
RPR: NONREACTIVE

## 2016-10-14 LAB — CBC
HEMATOCRIT: 40.6 % (ref 34.0–46.6)
Hemoglobin: 12.6 g/dL (ref 11.1–15.9)
MCH: 27.2 pg (ref 26.6–33.0)
MCHC: 31 g/dL — AB (ref 31.5–35.7)
MCV: 88 fL (ref 79–97)
PLATELETS: 213 10*3/uL (ref 150–379)
RBC: 4.63 x10E6/uL (ref 3.77–5.28)
RDW: 14.6 % (ref 12.3–15.4)
WBC: 6.1 10*3/uL (ref 3.4–10.8)

## 2016-10-14 LAB — HEPATIC FUNCTION PANEL
ALT: 15 IU/L (ref 0–32)
AST: 23 IU/L (ref 0–40)
Albumin: 4.3 g/dL (ref 3.5–5.5)
Alkaline Phosphatase: 68 IU/L (ref 39–117)
Bilirubin Total: 0.2 mg/dL (ref 0.0–1.2)
Bilirubin, Direct: 0.07 mg/dL (ref 0.00–0.40)
Total Protein: 6.9 g/dL (ref 6.0–8.5)

## 2016-10-18 LAB — CERVICOVAGINAL ANCILLARY ONLY
BACTERIAL VAGINITIS: POSITIVE — AB
CANDIDA VAGINITIS: NEGATIVE
Chlamydia: NEGATIVE
NEISSERIA GONORRHEA: NEGATIVE
Trichomonas: NEGATIVE

## 2016-10-19 ENCOUNTER — Other Ambulatory Visit: Payer: Self-pay | Admitting: Family Medicine

## 2016-10-19 DIAGNOSIS — B351 Tinea unguium: Secondary | ICD-10-CM

## 2016-10-19 LAB — CYTOLOGY - PAP
Diagnosis: NEGATIVE
HPV (WINDOPATH): NOT DETECTED

## 2016-10-19 MED ORDER — TERBINAFINE HCL 250 MG PO TABS: 250.0000 mg | ORAL_TABLET | Freq: Every day | ORAL | 0 refills | Status: DC

## 2016-10-19 MED FILL — TERBINAFINE HCL 250 MG TAB: 250 | 30 days supply | Qty: 30 | Fill #0

## 2016-10-20 ENCOUNTER — Encounter: Payer: Self-pay | Admitting: Family Medicine

## 2016-10-24 ENCOUNTER — Ambulatory Visit: Payer: Self-pay

## 2016-10-24 ENCOUNTER — Telehealth: Payer: Self-pay

## 2016-10-24 NOTE — Telephone Encounter (Signed)
CMA call regarding lab results   Patient verify DOB  Patient was aware and understood  

## 2016-10-24 NOTE — Telephone Encounter (Signed)
-----   Message from Lizbeth BarkMandesia R Hairston, FNP sent at 10/19/2016  1:49 PM EDT ----- Pap smear showed no lesions or malignancy.

## 2016-10-24 NOTE — Telephone Encounter (Signed)
-----   Message from Lizbeth BarkMandesia R Hairston, FNP sent at 10/19/2016  8:59 AM EDT ----- Still awaiting pap results. HIV, Hepatitis B, and syphilis are all negative. Gonorrhea, Chlamydia, Yeast, and Trichomonas were all negative. Bacterial vaginosis was positive. BV is caused by an overgrowth of germs in the vagina. Complete your metronidazole to treat. To reduce your risk of developing BV don't douche, don't use scented soap or sprays, and use protection during sexual intercourse. Liver function normal Labs that evaluated your blood cells are normal. No signs of anemia, infection, or inflammation present. You will be prescribed Lamisil to take for foot fungus. You'll need to schedule lab appointment in 1 month to recheck liver function and blood cells. Recommend in office follow up in 2 months.

## 2016-10-25 LAB — CERVICOVAGINAL ANCILLARY ONLY: HERPES (WINDOWPATH): NEGATIVE

## 2016-11-11 ENCOUNTER — Ambulatory Visit: Payer: Self-pay | Admitting: Family Medicine

## 2017-06-21 ENCOUNTER — Ambulatory Visit: Payer: Self-pay | Attending: Family Medicine

## 2017-08-03 ENCOUNTER — Ambulatory Visit: Payer: Self-pay | Admitting: Internal Medicine

## 2017-08-25 ENCOUNTER — Ambulatory Visit: Payer: Self-pay | Attending: Internal Medicine

## 2017-10-17 ENCOUNTER — Encounter: Payer: Self-pay | Admitting: Internal Medicine

## 2017-10-17 ENCOUNTER — Ambulatory Visit: Payer: Self-pay | Attending: Internal Medicine | Admitting: Internal Medicine

## 2017-10-17 VITALS — BP 118/74 | HR 72 | Temp 98.1°F | Resp 16 | Wt 179.4 lb

## 2017-10-17 DIAGNOSIS — D509 Iron deficiency anemia, unspecified: Secondary | ICD-10-CM | POA: Insufficient documentation

## 2017-10-17 DIAGNOSIS — B353 Tinea pedis: Secondary | ICD-10-CM | POA: Insufficient documentation

## 2017-10-17 DIAGNOSIS — Z833 Family history of diabetes mellitus: Secondary | ICD-10-CM | POA: Insufficient documentation

## 2017-10-17 DIAGNOSIS — Z8249 Family history of ischemic heart disease and other diseases of the circulatory system: Secondary | ICD-10-CM | POA: Insufficient documentation

## 2017-10-17 DIAGNOSIS — B351 Tinea unguium: Secondary | ICD-10-CM | POA: Insufficient documentation

## 2017-10-17 MED ORDER — CICLOPIROX 8 % EX SOLN: Freq: Every day | CUTANEOUS | 1 refills | Status: DC

## 2017-10-17 MED FILL — CICLOPIROX 8% SOLUTION: 8 | 20 days supply | Qty: 7 | Fill #0

## 2017-10-17 NOTE — Progress Notes (Signed)
Patient ID: Sharon Richardson, female    DOB: [birth year]  MRN: [id number]  CC: re-establish   Subjective: Sharon Richardson is a 47 y.o. female who presents for f/u and to est with me as PCP.  Her concerns today include:   Pt c/o having fungus of toe nails and skin of LT foot -endorses itching and peeling x 2 wks and changes in nails of 1st and 5th toes x yrs -has tried ointments in past but no improvement.  Prescribed PO Lamisil in the past but did not tolerate it.  She is afraid of taking oral medication because she was told that it can affect the liver.  Patient Active Problem List   Diagnosis Date Noted  . ANEMIA, IRON DEFICIENCY 06/10/2009  . OTHER GENERAL SYMPTOMS 06/04/2009  . Acute upper respiratory infection 02/05/2009  . ACUTE SINUSITIS, UNSPECIFIED 03/03/2008  . CERUMEN IMPACTION, BILATERAL 02/18/2008     Current Outpatient Medications on File Prior to Visit  Medication Sig Dispense Refill  . ferrous sulfate 325 (65 FE) MG tablet Take 1 tablet (325 mg total) by mouth daily with breakfast. Reported on 08/06/2015 90 tablet 3   No current facility-administered medications on file prior to visit.     No Known Allergies  Social History   Socioeconomic History  . Marital status: Married    Spouse name: Not on file  . Number of children: Not on file  . Years of education: Not on file  . Highest education level: Not on file  Occupational History  . Not on file  Social Needs  . Financial resource strain: Not on file  . Food insecurity:    Worry: Not on file    Inability: Not on file  . Transportation needs:    Medical: Not on file    Non-medical: Not on file  Tobacco Use  . Smoking status: Never Smoker  . Smokeless tobacco: Never Used  Substance and Sexual Activity  . Alcohol use: No  . Drug use: No  . Sexual activity: Not on file  Lifestyle  . Physical activity:    Days per week: Not on file    Minutes per session: Not on file  .  Stress: Not on file  Relationships  . Social connections:    Talks on phone: Not on file    Gets together: Not on file    Attends religious service: Not on file    Active member of club or organization: Not on file    Attends meetings of clubs or organizations: Not on file    Relationship status: Not on file  . Intimate partner violence:    Fear of current or ex partner: Not on file    Emotionally abused: Not on file    Physically abused: Not on file    Forced sexual activity: Not on file  Other Topics Concern  . Not on file  Social History Narrative  . Not on file    Family History  Problem Relation Age of Onset  . Diabetes Mother   . Hypertension Father     Past Surgical History:  Procedure Laterality Date  . CESAREAN SECTION      ROS: Review of Systems Negative except as stated above PHYSICAL EXAM: BP 118/74   Pulse 72   Temp 98.1 F (36.7 C) (Oral)   Resp 16   Wt 179 lb 6.4 oz (81.4 kg)   SpO2 98%   BMI 35.04 kg/m   Physical Exam  General  appearance - alert, well appearing, and in no distress Mental status - normal mood, behavior, speech, dress, motor activity, and thought processes Skin - NAILS: onychomycosis of the toenails of the first and fifth toes left foot.  She also has peeling of the skin on the lateral heel of this foot.  ASSESSMENT AND PLAN: 1. Onychomycosis We discussed trial of topical antifungal solution like Penlac.  Patient advised that it does not work as well as oral medication.  However she is willing to give it a try.  I went over how to use it. - ciclopirox (PENLAC) 8 % solution; Apply topically at bedtime. Apply over nail and surrounding skin. Apply daily over previous coat. After seven (7) days, may remove with alcohol and continue cycle.  Dispense: 6.6 mL; Refill: 1 2. Tinea pedis of left foot Recommend over-the-counter Lamisil cream   Patient was given the opportunity to ask questions.  Patient verbalized understanding of the plan  and was able to repeat key elements of the plan.  [employer] interpreter used during this encounter.  No orders of the defined types were placed in this encounter.    Requested Prescriptions   Signed Prescriptions Disp Refills  . ciclopirox (PENLAC) 8 % solution 6.6 mL 1    Sig: Apply topically at bedtime. Apply over nail and surrounding skin. Apply daily over previous coat. After seven (7) days, may remove with alcohol and continue cycle.    Return if symptoms worsen or fail to improve.  Jonah Blueeborah Johnson, MD, FACP

## 2017-10-17 NOTE — Patient Instructions (Signed)
Infeccin por hongos en las uas  (Fungal Nail Infection)  La infeccin por hongos en las uas es una infeccin por hongos frecuente en las uas de los pies o de las manos. Este trastorno afecta las uas de los pies con ms frecuencia que las uas de las manos. Ms de una ua puede infectarse. Esta afeccin puede transmitirse de una persona a otra (es  contagiosa).  CAUSAS  La causa de esta afeccin es un hongo. Existen distintos tipos de hongos que pueden causar la infeccin. Estos hongos son ms frecuentes en las zonas hmedas y clidas. Si las manos o los pies entran en contacto con los hongos, se pueden introducir en una ruptura de las uas de las manos o de los pies y producir la infeccin.  FACTORES DE RIESGO  Los siguientes factores pueden hacer que usted sea propenso a sufrir esta afeccin:   Ser varn.   Tener diabetes.   Ser una persona de edad avanzada.   Convivir con alguien que tiene hongos.   Caminar descalzo en zonas donde proliferan hongos, como duchas o vestuarios.   Tener mala circulacin.   Usar zapatos y calcetines que hacen transpirar los pies.   Tener pie de atleta.   Tener una ua lastimada o antecedentes recientes de una ciruga de uas.   Tener psoriasis.   Debilitamiento del sistema de defensa del cuerpo (sistema inmunitario).  SNTOMAS  Los sntomas de esta afeccin incluyen lo siguiente:   Una mancha plida sobre la ua.   Engrosamiento de la ua.   Una ua que se torna amarilla o marrn.   Bordes de las uas rugosos o quebradizos.   Una ua que se cae.   Una ua que se ha desprendido del lecho ungueal.  DIAGNSTICO  Esta afeccin se diagnostica mediante un examen fsico. El mdico podr tomar una muestra de la ua para examinarla y detectar si tiene hongos.  TRATAMIENTO  Las infecciones leves no necesitan tratamiento. Si tiene cambios importantes en las uas, el tratamiento puede incluir lo siguiente:   Medicamentos antimicticos por va oral. Deber tomar los  medicamentos durante algunas semanas o meses y no ver los resultados hasta despus de un largo tiempo. Estos medicamentos pueden tener efectos secundarios. Consulte al mdico sobre los problemas a los que debe estar atento.   Cremas y esmaltes para uas antimicticos. Se pueden usar junto con los medicamentos antimicticos que se administran por va oral.   Tratamiento lser de las uas.   Ciruga para extirpar la ua. Esto puede ser necesario en los casos ms graves de infecciones.  El tratamiento es muy largo y la infeccin puede reaparecer.  INSTRUCCIONES PARA EL CUIDADO EN EL HOGAR  Medicamentos   Tome o aplquese los medicamentos de venta libre y recetados solamente como se lo haya indicado el mdico.   Consulte al mdico sobre el uso de pomadas mentoladas para las uas de venta libre.  Estilo de vida   No comparta elementos personales como toallas o cortauas.   Crtese las uas con frecuencia.   Lvese y squese las manos y los pies todos los das.   Use calcetines absorbentes y cmbiese los calcetines con frecuencia.   Use un tipo de calzado que permita que el aire circule, como sandalias o zapatillas de lona. Deseche los zapatos viejos.   Use guantes de goma si est trabajando con sus manos en lugares mojados.   No camine descalzo en duchas o vestuarios.   No concurra a un saln   de cosmtica de uas si no usan instrumentos limpios.   No use uas artificiales.  Instrucciones generales   Concurra a todas las visitas de control como se lo haya indicado el mdico. Esto es importante.   Aplquese polvo antimictico en los pies y en los zapatos.  SOLICITE ATENCIN MDICA SI:  La infeccin no mejora o si empeora despus de varios meses.  Esta informacin no tiene como fin reemplazar el consejo del mdico. Asegrese de hacerle al mdico cualquier pregunta que tenga.  Document Released: 12/15/2004 Document Revised: 06/29/2015 Document Reviewed: 09/08/2014  Elsevier Interactive Patient Education   2018 [employer]

## 2018-01-22 ENCOUNTER — Ambulatory Visit: Payer: Self-pay | Attending: Internal Medicine

## 2018-03-09 ENCOUNTER — Ambulatory Visit: Payer: Self-pay | Attending: Family Medicine

## 2018-04-03 ENCOUNTER — Ambulatory Visit: Payer: Self-pay | Admitting: Critical Care Medicine

## 2018-05-24 ENCOUNTER — Encounter: Payer: Self-pay | Admitting: Internal Medicine

## 2018-07-20 ENCOUNTER — Encounter: Payer: Self-pay | Admitting: Internal Medicine

## 2018-10-02 ENCOUNTER — Encounter: Payer: Self-pay | Admitting: Internal Medicine

## 2018-11-15 ENCOUNTER — Other Ambulatory Visit: Payer: Self-pay

## 2018-11-15 ENCOUNTER — Ambulatory Visit: Payer: Self-pay | Attending: Internal Medicine

## 2018-11-16 ENCOUNTER — Encounter: Payer: Self-pay | Admitting: Internal Medicine

## 2018-11-16 ENCOUNTER — Other Ambulatory Visit: Payer: Self-pay

## 2018-11-16 ENCOUNTER — Ambulatory Visit: Payer: Self-pay | Attending: Internal Medicine | Admitting: Internal Medicine

## 2018-11-16 VITALS — BP 132/80 | HR 69 | Temp 98.1°F | Resp 16 | Ht 60.5 in | Wt 183.8 lb

## 2018-11-16 DIAGNOSIS — R030 Elevated blood-pressure reading, without diagnosis of hypertension: Secondary | ICD-10-CM | POA: Insufficient documentation

## 2018-11-16 DIAGNOSIS — Z0001 Encounter for general adult medical examination with abnormal findings: Secondary | ICD-10-CM

## 2018-11-16 DIAGNOSIS — Z0000 Encounter for general adult medical examination without abnormal findings: Secondary | ICD-10-CM

## 2018-11-16 DIAGNOSIS — Z1239 Encounter for other screening for malignant neoplasm of breast: Secondary | ICD-10-CM

## 2018-11-16 DIAGNOSIS — Z23 Encounter for immunization: Secondary | ICD-10-CM

## 2018-11-16 DIAGNOSIS — E669 Obesity, unspecified: Secondary | ICD-10-CM

## 2018-11-16 DIAGNOSIS — H6123 Impacted cerumen, bilateral: Secondary | ICD-10-CM

## 2018-11-16 DIAGNOSIS — Z7189 Other specified counseling: Secondary | ICD-10-CM

## 2018-11-16 NOTE — Progress Notes (Signed)
Patient ID: Sharon Richardson, female    DOB: 1971/03/14  MRN: [id number]  CC: Annual Exam   Subjective: Sharon Richardson is a 48 y.o. female who presents for annual exam  Her concerns today include:  Pt with no significant PMH  Not due for PAP until next yr  Patient Active Problem List   Diagnosis Date Noted  . ANEMIA, IRON DEFICIENCY 06/10/2009  . OTHER GENERAL SYMPTOMS 06/04/2009  . Acute upper respiratory infection 02/05/2009  . ACUTE SINUSITIS, UNSPECIFIED 03/03/2008  . CERUMEN IMPACTION, BILATERAL 02/18/2008     Current Outpatient Medications on File Prior to Visit  Medication Sig Dispense Refill  . ciclopirox (PENLAC) 8 % solution Apply topically at bedtime. Apply over nail and surrounding skin. Apply daily over previous coat. After seven (7) days, may remove with alcohol and continue cycle. (Patient not taking: Reported on 11/16/2018) 6.6 mL 1  . ferrous sulfate 325 (65 FE) MG tablet Take 1 tablet (325 mg total) by mouth daily with breakfast. Reported on 08/06/2015 90 tablet 3   No current facility-administered medications on file prior to visit.     No Known Allergies  Social History   Socioeconomic History  . Marital status: Married    Spouse name: Not on file  . Number of children: Not on file  . Years of education: Not on file  . Highest education level: Not on file  Occupational History  . Not on file  Social Needs  . Financial resource strain: Not on file  . Food insecurity    Worry: Not on file    Inability: Not on file  . Transportation needs    Medical: Not on file    Non-medical: Not on file  Tobacco Use  . Smoking status: Never Smoker  . Smokeless tobacco: Never Used  Substance and Sexual Activity  . Alcohol use: No  . Drug use: No  . Sexual activity: Not on file  Lifestyle  . Physical activity    Days per week: Not on file    Minutes per session: Not on file  . Stress: Not on file  Relationships  . Social  Herbalist on phone: Not on file    Gets together: Not on file    Attends religious service: Not on file    Active member of club or organization: Not on file    Attends meetings of clubs or organizations: Not on file    Relationship status: Not on file  . Intimate partner violence    Fear of current or ex partner: Not on file    Emotionally abused: Not on file    Physically abused: Not on file    Forced sexual activity: Not on file  Other Topics Concern  . Not on file  Social History Narrative  . Not on file    Family History  Problem Relation Age of Onset  . Diabetes Mother   . Hypertension Father     Past Surgical History:  Procedure Laterality Date  . CESAREAN SECTION      ROS: Review of Systems  Constitutional:       Not getting in much exercise.  Eating only 2 meals a day. Drinks mainly water, regular sodas.    HENT: Negative for hearing loss, sinus pressure, sinus pain and sore throat.   Eyes: Negative for visual disturbance.  Respiratory: Negative for cough and shortness of breath.   Cardiovascular: Negative for chest pain.  Gastrointestinal: Negative for abdominal  pain and blood in stool.  Endocrine: Negative for polydipsia and polyphagia.  Genitourinary: Negative for difficulty urinating.       Menses sometimes skips a mth  Musculoskeletal: Negative for arthralgias.  Neurological: Negative for dizziness and headaches.  Psychiatric/Behavioral: Negative for dysphoric mood. The patient is not nervous/anxious.    PHYSICAL EXAM: BP 132/80   Pulse 69   Temp 98.1 F (36.7 C) (Oral)   Resp 16   Ht 5' 0.5" (1.537 m)   Wt 183 lb 12.8 oz (83.4 kg)   SpO2 99%   BMI 35.31 kg/m   Wt Readings from Last 3 Encounters:  11/16/18 183 lb 12.8 oz (83.4 kg)  10/17/17 179 lb 6.4 oz (81.4 kg)  10/13/16 169 lb (76.7 kg)    Physical Exam  General appearance - alert, well appearing, obese middle age female and in no distress Mental status - normal mood,  behavior, speech, dress, motor activity, and thought processes Eyes - pupils equal and reactive, extraocular eye movements intact Ears - ceruminosis noted Nose - normal and patent, no erythema, discharge or polyps Mouth - mucous membranes moist, pharynx normal without lesions Neck - supple, no significant adenopathy Lymphatics - no palpable lymphadenopathy, no hepatosplenomegaly Chest - clear to auscultation, no wheezes, rales or rhonchi, symmetric air entry Heart - normal rate, regular rhythm, normal S1, S2, no murmurs, rubs, clicks or gallops Abdomen - soft, nontender, nondistended, no masses or organomegaly Breasts - breasts appear normal, no suspicious masses, no skin or nipple changes or axillary nodes Pelvic - deferred as she is not due for pap Musculoskeletal - no joint tenderness, deformity or swelling Extremities - peripheral pulses normal, no pedal edema, no clubbing or cyanosis  Depression screen [facility] 2/9 10/17/2017 10/13/2016 01/13/2016  Decreased Interest 0 0 0  Down, Depressed, Hopeless 0 0 0  PHQ - 2 Score 0 0 0  Altered sleeping - 1 2  Tired, decreased energy - 0 1  Change in appetite - 0 0  Feeling bad or failure about yourself  - 0 0  Trouble concentrating - 0 0  Moving slowly or fidgety/restless - 0 0  Suicidal thoughts - 0 0  PHQ-9 Score - 1 3  Difficult doing work/chores - - -     CMP Latest Ref Rng & Units 10/13/2016 08/06/2015 02/16/2015  Glucose 65 - 99 mg/dL - 92 563(S)  BUN 7 - 25 mg/dL - 15 17  Creatinine 9.37 - 1.10 mg/dL - 3.42 8.76  Sodium 811 - 146 mmol/L - 136 137  Potassium 3.5 - 5.3 mmol/L - 4.5 3.8  Chloride 98 - 110 mmol/L - 104 105  CO2 20 - 31 mmol/L - 24 23  Calcium 8.6 - 10.2 mg/dL - 8.7 9.3  Total Protein 6.0 - 8.5 g/dL 6.9 6.8 6.9  Total Bilirubin 0.0 - 1.2 mg/dL 0.2 0.4 0.3  Alkaline Phos 39 - 117 IU/L 68 54 61  AST 0 - 40 IU/L 23 26 25   ALT 0 - 32 IU/L 15 24 18    Lipid Panel     Component Value Date/Time   CHOL 166 12/26/2014  0910   TRIG 71 12/26/2014 0910   HDL 54 12/26/2014 0910   CHOLHDL 3.1 12/26/2014 0910   VLDL 14 12/26/2014 0910   LDLCALC 98 12/26/2014 0910    CBC    Component Value Date/Time   WBC 6.1 10/13/2016 1534   WBC 4.9 01/13/2016 1140   RBC 4.63 10/13/2016 1534   RBC 4.76 01/13/2016  1140   HGB 12.6 10/13/2016 1534   HCT 40.6 10/13/2016 1534   PLT 213 10/13/2016 1534   MCV 88 10/13/2016 1534   MCH 27.2 10/13/2016 1534   MCH 28.6 01/13/2016 1140   MCHC 31.0 (L) 10/13/2016 1534   MCHC 32.2 01/13/2016 1140   RDW 14.6 10/13/2016 1534   LYMPHSABS 1,666 01/13/2016 1140   MONOABS 245 01/13/2016 1140   EOSABS 98 01/13/2016 1140   BASOSABS 0 01/13/2016 1140    ASSESSMENT AND PLAN: 1. Annual physical exam 2. Obesity (BMI 35.0-39.9 without comorbidity) Dietary counseling given along with printed information. Advised 150 minutes/week of moderate intensity exercise - CBC - Comprehensive metabolic panel - Lipid panel - Hemoglobin A1c  3. Elevated blood pressure reading DASH diet discussed and encouraged.  We will have her follow-up with clinical pharmacist in 1 month for repeat blood pressure check.  4. Bilateral impacted cerumen Advised using wax softener over-the-counter.  If after using for several days she would like to have ears flushed she can come back as a nurse only visit.  5. Educated About Covid-19 Virus Infection Discussed the importance of wearing mask, social distancing and frequent handwashing to prevent giving COVID-19  6. Need for influenza vaccination Given  7. Breast cancer screening - MM Digital Screening; Future     Patient was given the opportunity to ask questions.  Patient verbalized understanding of the plan and was able to repeat key elements of the plan.  [employer] interpreter used during this encounter. #[id number].  Orders Placed This Encounter  Procedures  . MM Digital Screening  . CBC  . Comprehensive metabolic panel  . Lipid panel  . Hemoglobin  A1c     Requested Prescriptions    No prescriptions requested or ordered in this encounter    Return if symptoms worsen or fail to improve.  Jonah Blueeborah , MD, FACP

## 2018-11-16 NOTE — Patient Instructions (Addendum)
Please give patient an appointment with the clinical pharmacist in 1 month for repeat blood pressure check.   Obesidad en los adultos Obesity, Adult La obesidad es un exceso de Air traffic controller. Ser obeso significa que su peso es ms de lo que es saludable para usted. El [facility] es un nmero que indica la cantidad de grasa corporal que tiene una persona. Si usted tiene un ndice de masa corporal (IMC) de 30o ms, esto significa que es obeso. A menudo, la causa de la obesidad es comer o beber ms caloras que las que el cuerpo [place]. Cambiar el estilo de vida puede ayudarlo a Sports coach de [place]. La obesidad puede causar problemas de salud graves, como los siguientes:  Accidente cerebrovascular.  Arteriopata coronaria (EAC).  Diabetes tipo 2.  Algunos tipos de cncer, incluido el cncer de colon, mama, tero y vescula.  Artrosis.  Presin arterial alta (hipertensin arterial).  Colesterol alto.  Apnea del sueo.  Clculos en la vescula.  Problemas de esterilidad. Cules son las causas?  Consumir todos los [place] con altos niveles de [place], Location manager y [place].  Nacer con genes que pueden hacerlo ms propenso a ser obeso.  Tener una afeccin mdica que causa obesidad.  Tomar ciertos medicamentos.  Permanecer mucho tiempo sentado (tener un estilo de vida sedentario).  No dormir lo suficiente.  Beber gran cantidad de bebidas con azcar. Qu incrementa el riesgo?  Tener antecedentes familiares de obesidad.  Ser mujer afroamericana.  Ser hombre de origen hispano.  Vivir en un rea con acceso limitado a las siguientes posibilidades: ? Parques, centros recreativos o veredas. ? Alimentos saludables, como se venden en tiendas de comestibles y mercados de [place]. Cules son los signos o los sntomas? El principal signo es tener demasiada grasa corporal. Cmo se trata?  El tratamiento de esta afeccin frecuentemente incluye cambiar el estilo de vida. El tratamiento  puede incluir: ? Cambios en la dieta. Esto puede incluir crear un plan de alimentacin saludable. ? Realizar actividad fsica. Puede incluir una actividad que hace que el corazn lata ms rpido (ejercicio [place]) y Chiropodist de Teacher, early years/pre. Trabaje con su mdico para disear un programa que funcione para usted. ? Medicamentos para ayudarlo a Administrator, Civil Service. Pueden utilizarse si no puede perder 1 libra (450g) por semana despus de 6 semanas de alimentacin saludable y ms ejercicio. ? Tratar las afecciones que causan la obesidad. ? Ciruga. Las opciones pueden incluir bandas gstricas y bypass gstrico. Esto puede realizarse en las siguientes situaciones:  Otros tratamientos no mejoraron su afeccin.  Tiene un IMC de 40 o superior.  Tiene problemas de salud potencialmente mortales relacionados con la obesidad. Siga estas instrucciones en su casa: Comida y bebida   Siga las instrucciones del mdico respecto de las comidas y las bebidas. Su mdico puede recomendarle lo siguiente: ? Limitar las comidas rpidas, los dulces y las colaciones procesadas. ? Elegir opciones con bajo contenido de [place]. Por ejemplo, Customer service manager de [employer]. ? Consumir 5o ms porciones de frutas o verduras por da. ? Comer en casa con ms frecuencia. Esto le da ms control sobre lo que come. ? Cuando coma afuera, elija alimentos saludables. ? Aprenda a leer las etiquetas de los alimentos. Esto le ayudar a aprender qu cantidad de alimento hay en una porcin. ? Tenga a mano colaciones con bajo contenido de grasas. ? Evite las bebidas que contengan mucha azcar. Estas incluyen refrescos, jugo de frutas, t helado con azcar y [place] saborizada.  Beba suficiente  agua para mantener el pis (la [place]) de color amarillo plido.  No siga las dietas de [place]. Actividad fsica  Haga ejercicios con frecuencia, como se lo haya indicado el mdico. La mayora de los adultos deben hacer hasta 150minutos de  ejercicio de intensidad moderada cada semana.Pregntele al mdico lo siguiente: ? Los tipos de ejercicios que son seguros para usted. ? La frecuencia con la que Lexmark Internationaldebe hacer los ejercicios.  Precaliente y elongue adecuadamente antes de hacer actividad fsica.  Haga un estiramiento lento despus de la actividad (relajacin).  Descanse entre los perodos de Penney Farmsactividad. Estilo de vida  Trabaje con su mdico y con un experto en alimentacin (nutricionista) para establecer un objetivo de prdida de peso que sea adecuado para usted.  Limite el tiempo que pasa frente a una pantalla.  Busque formas de recompensarse que no incluyan alimentos.  No beba alcohol si: ? El mdico le indica que no lo haga. ? Est embarazada, puede estar embarazada o est tratando de quedar embarazada.  Si bebe alcohol: ? Limite la cantidad que bebe a lo siguiente:  De 0 a 1 medida por da para las mujeres.  De 0 a 2 medidas por da para los hombres. ? Est atento a la cantidad de alcohol que hay en las bebidas que toma. En los [place], una medida equivale a una botella de cerveza de 12oz (355ml), un vaso de vino de 5oz (148ml) o un vaso de una bebida alcohlica de alta graduacin de 1oz (44ml). Instrucciones generales  Lleve un diario de su prdida de peso. Esto puede ayudarlo a Youth workermantener un registro de lo siguiente: ? Los alimentos que come. ? Cunto ejercicio realiza.  Tome los medicamentos de venta libre y los recetados solamente como se lo haya indicado el mdico.  Tome vitaminas y suplementos solamente como se lo haya indicado el mdico.  Considere participar en un grupo de apoyo.  Concurra a todas las visitas de [address] como se lo haya indicado el mdico. Esto es importante. Comunquese con un mdico si:  No puede alcanzar su objetivo de prdida de peso despus de haber modificado su dieta y su estilo de vida durante 6semanas. Solicite ayuda inmediatamente si:  Tiene dificultad  para respirar.  Tiene pensamientos acerca de lastimarse. Resumen  La obesidad es un exceso de Art gallery managergrasa corporal.  Ser obeso significa que su peso es ms de lo que es saludable para usted.  Trabaje con su mdico para establecer un objetivo de prdida de [place].  Haga actividad fsica con regularidad tal como le indic el mdico. Esta informacin no tiene Theme park managercomo fin reemplazar el consejo del mdico. Asegrese de hacerle al mdico cualquier pregunta que tenga. Document Released: 09/06/2011 Document Revised: 11/30/2017 Document Reviewed: 11/30/2017 Elsevier Patient Education  2020 Elsevier Inc.   Influenza Virus Vaccine injection (Fluarix) Qu es este medicamento? La VACUNA ANTIGRIPAL ayuda a disminuir el riesgo de contraer la influenza, tambin conocida como la gripe. La vacuna solo ayuda a protegerle contra algunas cepas de influenza. Esta vacuna no ayuda a reducir Nurse, adultel riesgo de contraer influenza pandmica H1N1. Este medicamento puede ser utilizado para otros usos; si tiene alguna pregunta consulte con su proveedor de atencin mdica o con su farmacutico. MARCAS COMUNES: Fluarix, Fluzone Qu le debo informar a mi profesional de la salud antes de tomar este medicamento? Necesita saber si usted presenta alguno de los siguientes problemas o situaciones:  trastorno de sangrado como hemofilia  fiebre o infeccin  sndrome de Guillain-Barre u otros problemas neurolgicos  problemas del sistema inmunolgico  infeccin por el virus de la inmunodeficiencia humana (VIH) o SIDA  niveles bajos de plaquetas en la sangre  esclerosis mltiple  una reaccin alrgica o inusual a las vacunas antigripales, a los huevos, protenas de pollo, al ltex, a la gentamicina, a otros medicamentos, alimentos, colorantes o conservantes  si est embarazada o buscando quedar embarazada  si est amamantando a un beb Cmo debo [employer]? Esta vacuna se administra mediante inyeccin por va  intramuscular. Lo administra un profesional de [employer]. Recibir una copia de informacin escrita sobre la vacuna antes de cada vacuna. Asegrese de leer este folleto cada vez cuidadosamente. Este folleto puede cambiar con frecuencia. Hable con su pediatra para informarse acerca del uso de este medicamento en nios. Puede requerir atencin especial. Sobredosis: Pngase en contacto inmediatamente con un centro toxicolgico o una sala de urgencia si usted cree que haya tomado demasiado medicamento. ATENCIN: [employer] es solo para usted. No comparta este medicamento con nadie. Qu sucede si me olvido de una dosis? No se aplica en este caso. Qu puede interactuar con este medicamento?  quimioterapia o radioterapia  medicamentos que suprimen el sistema inmunolgico, tales como etanercept, anakinra, infliximab y adalimumab  medicamentos que tratan o previenen cogulos sanguneos, como warfarina  fenitona  medicamentos esteroideos, como la prednisona o la cortisona  teofilina  vacunas Puede ser que esta lista no menciona todas las posibles interacciones. Informe a su profesional de [employer] de [employer] productos a base de hierbas, medicamentos de [place] o suplementos nutritivos que est tomando. Si usted fuma, consume bebidas alcohlicas o si utiliza drogas ilegales, indqueselo tambin a su profesional de [employer]. Algunas sustancias pueden interactuar con su medicamento. A qu debo estar atento al usar [employer]? Informe a su mdico o a Producer, television/film/video de la [employer] todos los efectos secundarios que persistan despus de [address]. Llame a su proveedor de atencin mdica si se presentan sntomas inusuales dentro de las 6 semanas posteriores a la vacunacin. Es posible que todava pueda contraer la gripe, pero la enfermedad no ser tan fuerte como normalmente. No puede contraer la gripe de esta vacuna. La vacuna antigripal no le protege contra resfros u otras enfermedades  que pueden causar [place]. Debe vacunarse cada ao. Qu efectos secundarios puedo tener al [employer] este medicamento? Efectos secundarios que debe informar a su mdico o a Producer, television/film/video de la salud tan pronto como sea posible:  Therapist, art como erupcin cutnea, picazn o urticarias, hinchazn de la cara, labios o lengua Efectos secundarios que, por lo general, no requieren atencin mdica (debe informarlos a su mdico o a su profesional de la salud si persisten o si son molestos):  fiebre  dolor de cabeza  molestias y Art gallery manager, sensibilidad, enrojecimiento o Paramedic de la inyeccin  cansancio o debilidad Puede ser que esta lista no menciona todos los posibles efectos secundarios. Comunquese a su mdico por asesoramiento mdico [employer]. Usted puede informar los efectos secundarios a la FDA por telfono al 1-800-FDA-1088. Dnde debo guardar mi medicina? Esta vacuna se administra solamente en clnicas, farmacias, consultorio mdico u otro consultorio de un profesional de la salud y no Teacher, early years/pre en su domicilio. ATENCIN: Este folleto es un resumen. Puede ser que no cubra toda la posible informacin. Si usted tiene preguntas acerca de esta medicina, consulte con su mdico, su farmacutico o su profesional de Radiographer, therapeutic.  2020 Elsevier/Gold Standard (  2009-09-08 15:31:40)  

## 2018-11-17 LAB — COMPREHENSIVE METABOLIC PANEL
ALT: 19 IU/L (ref 0–32)
AST: 18 IU/L (ref 0–40)
Albumin/Globulin Ratio: 1.4 (ref 1.2–2.2)
Albumin: 4 g/dL (ref 3.8–4.8)
Alkaline Phosphatase: 81 IU/L (ref 39–117)
BUN/Creatinine Ratio: 19 (ref 9–23)
BUN: 11 mg/dL (ref 6–24)
Bilirubin Total: 0.2 mg/dL (ref 0.0–1.2)
CO2: 22 mmol/L (ref 20–29)
Calcium: 9.1 mg/dL (ref 8.7–10.2)
Chloride: 103 mmol/L (ref 96–106)
Creatinine, Ser: 0.58 mg/dL (ref 0.57–1.00)
GFR calc Af Amer: 127 mL/min/{1.73_m2} (ref >59)
GFR calc non Af Amer: 110 mL/min/{1.73_m2} (ref >59)
Globulin, Total: 2.8 g/dL (ref 1.5–4.5)
Glucose: 90 mg/dL (ref 65–99)
Potassium: 4.3 mmol/L (ref 3.5–5.2)
Sodium: 138 mmol/L (ref 134–144)
Total Protein: 6.8 g/dL (ref 6.0–8.5)

## 2018-11-17 LAB — CBC
Hematocrit: 38.3 % (ref 34.0–46.6)
Hemoglobin: 12 g/dL (ref 11.1–15.9)
MCH: 24.7 pg — ABNORMAL LOW (ref 26.6–33.0)
MCHC: 31.3 g/dL — ABNORMAL LOW (ref 31.5–35.7)
MCV: 79 fL (ref 79–97)
Platelets: 268 10*3/uL (ref 150–450)
RBC: 4.85 x10E6/uL (ref 3.77–5.28)
RDW: 15.1 % (ref 11.7–15.4)
WBC: 7 10*3/uL (ref 3.4–10.8)

## 2018-11-17 LAB — LIPID PANEL
Chol/HDL Ratio: 3.3 ratio (ref 0.0–4.4)
Cholesterol, Total: 186 mg/dL (ref 100–199)
HDL: 56 mg/dL (ref >39)
LDL Calculated: 104 mg/dL — ABNORMAL HIGH (ref 0–99)
Triglycerides: 128 mg/dL (ref 0–149)
VLDL Cholesterol Cal: 26 mg/dL (ref 5–40)

## 2018-11-17 LAB — HEMOGLOBIN A1C
Est. average glucose Bld gHb Est-mCnc: 117 mg/dL
Hgb A1c MFr Bld: 5.7 % — ABNORMAL HIGH (ref 4.8–5.6)

## 2018-11-21 ENCOUNTER — Telehealth: Payer: Self-pay

## 2018-11-21 NOTE — Telephone Encounter (Signed)
[employer] interpreters Yuli  Id# [id number]  contacted pt to go over lab results pt didn't answer lvm asking pt to give me a call at her earliest convenience  

## 2018-12-27 ENCOUNTER — Other Ambulatory Visit: Payer: Self-pay

## 2018-12-27 ENCOUNTER — Ambulatory Visit
Admission: RE | Admit: 2018-12-27 | Discharge: 2018-12-27 | Disposition: A | Discharge status: Home / Self Care | Payer: No Typology Code available for payment source | Source: Ambulatory Visit | Attending: Internal Medicine | Admitting: Internal Medicine

## 2018-12-27 DIAGNOSIS — Z1239 Encounter for other screening for malignant neoplasm of breast: Secondary | ICD-10-CM

## 2018-12-31 ENCOUNTER — Other Ambulatory Visit: Payer: Self-pay | Admitting: Internal Medicine

## 2018-12-31 DIAGNOSIS — R928 Other abnormal and inconclusive findings on diagnostic imaging of breast: Secondary | ICD-10-CM

## 2019-01-23 ENCOUNTER — Other Ambulatory Visit (HOSPITAL_COMMUNITY): Payer: Self-pay | Admitting: *Deleted

## 2019-01-23 DIAGNOSIS — N6489 Other specified disorders of breast: Secondary | ICD-10-CM

## 2019-01-30 ENCOUNTER — Ambulatory Visit (HOSPITAL_COMMUNITY)
Admission: RE | Admit: 2019-01-30 | Discharge: 2019-01-30 | Disposition: A | Discharge status: Home / Self Care | Payer: No Typology Code available for payment source | Source: Ambulatory Visit | Attending: Obstetrics and Gynecology | Admitting: Obstetrics and Gynecology

## 2019-01-30 ENCOUNTER — Encounter (HOSPITAL_COMMUNITY): Payer: Self-pay

## 2019-01-30 ENCOUNTER — Other Ambulatory Visit: Payer: Self-pay

## 2019-01-30 DIAGNOSIS — Z1239 Encounter for other screening for malignant neoplasm of breast: Secondary | ICD-10-CM

## 2019-01-30 NOTE — Progress Notes (Signed)
Patient referred to [facility] due to recommending additional imaging of the left breast. Screening mammogram completed 12/28/2018.  Pap Smear: Pap smear not completed today. Last Pap smear was 10/13/2016 at [facility] and normal with negative HPV. Per patient has no history of an abnormal Pap smear. Last Pap smear result is in Epic.  Physical exam: Breasts Breasts symmetrical. No skin abnormalities bilateral breasts. No nipple retraction bilateral breasts. No nipple discharge bilateral breasts. No lymphadenopathy. No lumps palpated bilateral breasts. No complaints of pain or tenderness on exam. Referred patient to the [place] for a left breast diagnostic mammogram and possible ultrasound per recommendation. Appointment scheduled for Thursday, January 31, 2019 at 1010.        Pelvic/Bimanual No Pap smear completed today since last Pap smear and HPV typing was 10/13/2016. Pap smear not indicated per BCCCP guidelines.   Smoking History: Patient has never smoked.  Patient Navigation: Patient education provided. Access to services provided for patient through [facility] program. Spanish interpreter provided.   Breast and Cervical Cancer Risk Assessment: Patient has no family history of breast cancer, known genetic mutations, or radiation treatment to the chest before age 75. Patient has no history of cervical dysplasia, immunocompromised, or DES exposure in-utero.  Risk Assessment    Risk Scores      01/30/2019   Last edited by: Loletta Parish, RN   5-year risk: 0.5 %   Lifetime risk: 5.3 %         Used Spanish interpreter Rudene Anda from [place].

## 2019-01-30 NOTE — Patient Instructions (Signed)
Explained breast self awareness with Sharon Richardson. Patient did not need a Pap smear today due to last Pap smear and HPV typing was 10/13/2016. Let her know BCCCP will cover Pap smears and HPV typing every 5 years unless has a history of abnormal Pap smears.Referred patient to the [place] for a left breast diagnostic mammogram and possible ultrasound per recommendation. Appointment scheduled for Thursday, January 31, 2019 at 1010. Patient aware of appointment and will be there. Sharon Richardson verbalized understanding.  Henrik Orihuela, Arvil Chaco, RN 2:50 PM

## 2019-01-31 ENCOUNTER — Ambulatory Visit: Payer: No Typology Code available for payment source

## 2019-01-31 ENCOUNTER — Other Ambulatory Visit (HOSPITAL_COMMUNITY): Payer: Self-pay | Admitting: Obstetrics and Gynecology

## 2019-01-31 ENCOUNTER — Ambulatory Visit
Admission: RE | Admit: 2019-01-31 | Discharge: 2019-01-31 | Disposition: A | Discharge status: Home / Self Care | Payer: No Typology Code available for payment source | Source: Ambulatory Visit | Attending: Obstetrics and Gynecology | Admitting: Obstetrics and Gynecology

## 2019-01-31 ENCOUNTER — Other Ambulatory Visit: Payer: No Typology Code available for payment source

## 2019-01-31 DIAGNOSIS — N6489 Other specified disorders of breast: Secondary | ICD-10-CM

## 2019-06-10 ENCOUNTER — Ambulatory Visit: Payer: No Typology Code available for payment source

## 2019-06-10 ENCOUNTER — Other Ambulatory Visit: Payer: Self-pay

## 2019-07-08 ENCOUNTER — Ambulatory Visit: Payer: No Typology Code available for payment source | Admitting: Internal Medicine

## 2019-07-22 ENCOUNTER — Other Ambulatory Visit: Payer: Self-pay

## 2019-07-22 ENCOUNTER — Ambulatory Visit: Payer: Self-pay | Attending: Internal Medicine | Admitting: Internal Medicine

## 2019-07-22 ENCOUNTER — Encounter: Payer: Self-pay | Admitting: Internal Medicine

## 2019-07-22 VITALS — BP 134/85 | HR 66 | Temp 97.9°F | Resp 16 | Wt 169.6 lb

## 2019-07-22 DIAGNOSIS — E669 Obesity, unspecified: Secondary | ICD-10-CM

## 2019-07-22 DIAGNOSIS — R7303 Prediabetes: Secondary | ICD-10-CM

## 2019-07-22 DIAGNOSIS — Z124 Encounter for screening for malignant neoplasm of cervix: Secondary | ICD-10-CM

## 2019-07-22 DIAGNOSIS — R030 Elevated blood-pressure reading, without diagnosis of hypertension: Secondary | ICD-10-CM

## 2019-07-22 LAB — POCT GLYCOSYLATED HEMOGLOBIN (HGB A1C): HbA1c, POC (prediabetic range): 5.4 % — AB (ref 5.7–6.4)

## 2019-07-22 LAB — GLUCOSE, POCT (MANUAL RESULT ENTRY): POC Glucose: 90 mg/dl (ref 70–99)

## 2019-07-22 NOTE — Patient Instructions (Signed)
Obesidad en los adultos Obesity, Adult La obesidad es un exceso de grasa corporal. Ser obeso significa que su peso es ms de lo que es saludable para usted. El IMC es un nmero que indica la cantidad de grasa corporal que tiene una persona. Si usted tiene un ndice de masa corporal (IMC) de 30o ms, esto significa que es obeso. A menudo, la causa de la obesidad es comer o beber ms caloras que las que el cuerpo usa. Cambiar el estilo de vida puede ayudarlo a bajar de peso. La obesidad puede causar problemas de salud graves, como los siguientes:  Accidente cerebrovascular.  Arteriopata coronaria (EAC).  Diabetes tipo 2.  Algunos tipos de cncer, incluido el cncer de colon, mama, tero y vescula.  Artrosis.  Presin arterial alta (hipertensin arterial).  Colesterol alto.  Apnea del sueo.  Clculos en la vescula.  Problemas de esterilidad. Cules son las causas?  Consumir todos los das alimentos con altos niveles de caloras, azcar y grasa.  Nacer con genes que pueden hacerlo ms propenso a ser obeso.  Tener una afeccin mdica que causa obesidad.  Tomar ciertos medicamentos.  Permanecer mucho tiempo sentado (tener un estilo de vida sedentario).  No dormir lo suficiente.  Beber gran cantidad de bebidas con azcar. Qu incrementa el riesgo?  Tener antecedentes familiares de obesidad.  Ser mujer afroamericana.  Ser hombre de origen hispano.  Vivir en un rea con acceso limitado a las siguientes posibilidades: ? Parques, centros recreativos o veredas. ? Alimentos saludables, como se venden en tiendas de comestibles y mercados de agricultores. Cules son los signos o los sntomas? El principal signo es tener demasiada grasa corporal. Cmo se trata?  El tratamiento de esta afeccin frecuentemente incluye cambiar el estilo de vida. El tratamiento puede incluir: ? Cambios en la dieta. Esto puede incluir crear un plan de alimentacin saludable. ? Realizar  actividad fsica. Puede incluir una actividad que hace que el corazn lata ms rpido (ejercicio aerbico) y entrenamiento de fuerza. Trabaje con su mdico para disear un programa que funcione para usted. ? Medicamentos para ayudarlo a perder peso. Pueden utilizarse si no puede perder 1 libra (450g) por semana despus de 6 semanas de alimentacin saludable y ms ejercicio. ? Tratar las afecciones que causan la obesidad. ? Ciruga. Las opciones pueden incluir bandas gstricas y bypass gstrico. Esto puede realizarse en las siguientes situaciones:  Otros tratamientos no mejoraron su afeccin.  Tiene un IMC de 40 o superior.  Tiene problemas de salud potencialmente mortales relacionados con la obesidad. Siga estas instrucciones en su casa: Comida y bebida   Siga las instrucciones del mdico respecto de las comidas y las bebidas. Su mdico puede recomendarle lo siguiente: ? Limitar las comidas rpidas, los dulces y las colaciones procesadas. ? Elegir opciones con bajo contenido de grasas. Por ejemplo, leche descremada en lugar de leche entera. ? Consumir 5o ms porciones de frutas o verduras por da. ? Comer en casa con ms frecuencia. Esto le da ms control sobre lo que come. ? Cuando coma afuera, elija alimentos saludables. ? Aprenda a leer las etiquetas de los alimentos. Esto le ayudar a aprender qu cantidad de alimento hay en una porcin. ? Tenga a mano colaciones con bajo contenido de grasas. ? Evite las bebidas que contengan mucha azcar. Estas incluyen refrescos, jugo de frutas, t helado con azcar y leche saborizada.  Beba suficiente agua para mantener el pis (la orina) de color amarillo plido.  No siga las dietas de moda. Actividad   fsica  Haga ejercicios con frecuencia, como se lo haya indicado el mdico. La mayora de los adultos deben hacer hasta 150minutos de ejercicio de intensidad moderada cada semana.Pregntele al mdico lo siguiente: ? Los tipos de ejercicios que  son seguros para usted. ? La frecuencia con la que debe hacer los ejercicios.  Precaliente y elongue adecuadamente antes de hacer actividad fsica.  Haga un estiramiento lento despus de la actividad (relajacin).  Descanse entre los perodos de actividad. Estilo de vida  Trabaje con su mdico y con un experto en alimentacin (nutricionista) para establecer un objetivo de prdida de peso que sea adecuado para usted.  Limite el tiempo que pasa frente a una pantalla.  Busque formas de recompensarse que no incluyan alimentos.  No beba alcohol si: ? El mdico le indica que no lo haga. ? Est embarazada, puede estar embarazada o est tratando de quedar embarazada.  Si bebe alcohol: ? Limite la cantidad que bebe a lo siguiente:  De 0 a 1 medida por da para las mujeres.  De 0 a 2 medidas por da para los hombres. ? Est atento a la cantidad de alcohol que hay en las bebidas que toma. En los [place], una medida equivale a una botella de cerveza de 12oz (355ml), un vaso de vino de 5oz (148ml) o un vaso de una bebida alcohlica de alta graduacin de 1oz (44ml). Instrucciones generales  Lleve un diario de su prdida de peso. Esto puede ayudarlo a mantener un registro de lo siguiente: ? Los alimentos que come. ? Cunto ejercicio realiza.  Tome los medicamentos de venta libre y los recetados solamente como se lo haya indicado el mdico.  Tome vitaminas y suplementos solamente como se lo haya indicado el mdico.  Considere participar en un grupo de apoyo.  Concurra a todas las visitas de seguimiento como se lo haya indicado el mdico. Esto es importante. Comunquese con un mdico si:  No puede alcanzar su objetivo de prdida de peso despus de haber modificado su dieta y su estilo de vida durante 6semanas. Solicite ayuda inmediatamente si:  Tiene dificultad para respirar.  Tiene pensamientos acerca de lastimarse. Resumen  La obesidad es un exceso de grasa  corporal.  Ser obeso significa que su peso es ms de lo que es saludable para usted.  Trabaje con su mdico para establecer un objetivo de prdida de peso.  Haga actividad fsica con regularidad tal como le indic el mdico. Esta informacin no tiene como fin reemplazar el consejo del mdico. Asegrese de hacerle al mdico cualquier pregunta que tenga. Document Revised: 11/30/2017 Document Reviewed: 11/30/2017 Elsevier Patient Education  2020 Elsevier Inc.  

## 2019-07-22 NOTE — Progress Notes (Signed)
Patient ID: Sharon Richardson, female    DOB: 10-Jan-1971  MRN: [id number]  CC: Gynecologic Exam   Subjective: Sharon Richardson is a 49 y.o. female who presents for gyn exam Her concerns today include:   G6P5 No abnormal pap in past Still gets menses but less frequent. LNMP was 04/2019.  Can skip up to 3 mths.  Endorses hot flashes but they are not too bothersome. Currently not sexually active.  Last sexual intercourse 1 yr ago.  Tubal ligation 12 yrs ago. Endorses clear vaginal dischg.  No vaginal itching.   No fhx of breast, cervical or uterine CA MMG 01/2019.  BP elev on last visit with me 10/2018. She does not limit salt in foods.  Loss 14 lbs since I last saw her 10/2018.  Reports having decrease appetite due to some family issues. Feels a little worried but not depressed.  Denies polydipsia or polyuria   Patient Active Problem List   Diagnosis Date Noted  . Screening breast examination 01/30/2019  . Elevated blood pressure reading 11/16/2018  . Obesity (BMI 35.0-39.9 without comorbidity) 11/16/2018  . ANEMIA, IRON DEFICIENCY 06/10/2009  . OTHER GENERAL SYMPTOMS 06/04/2009  . Acute upper respiratory infection 02/05/2009  . ACUTE SINUSITIS, UNSPECIFIED 03/03/2008  . CERUMEN IMPACTION, BILATERAL 02/18/2008     Current Outpatient Medications on File Prior to Visit  Medication Sig Dispense Refill  . ciclopirox (PENLAC) 8 % solution Apply topically at bedtime. Apply over nail and surrounding skin. Apply daily over previous coat. After seven (7) days, may remove with alcohol and continue cycle. (Patient not taking: Reported on 11/16/2018) 6.6 mL 1  . ferrous sulfate 325 (65 FE) MG tablet Take 1 tablet (325 mg total) by mouth daily with breakfast. Reported on 08/06/2015 (Patient not taking: Reported on 07/22/2019) 90 tablet 3   No current facility-administered medications on file prior to visit.    No Known Allergies  Social History   Socioeconomic History    . Marital status: Married    Spouse name: Not on file  . Number of children: Not on file  . Years of education: Not on file  . Highest education level: 9th grade  Occupational History  . Not on file  Tobacco Use  . Smoking status: Never Smoker  . Smokeless tobacco: Never Used  Substance and Sexual Activity  . Alcohol use: No  . Drug use: No  . Sexual activity: Yes    Birth control/protection: Surgical  Other Topics Concern  . Not on file  Social History Narrative  . Not on file   Social Determinants of Health   Financial Resource Strain:   . Difficulty of Paying Living Expenses:   Food Insecurity:   . Worried About Programme researcher, broadcasting/film/video in the Last Year:   . Barista in the Last Year:   Transportation Needs: No Transportation Needs  . Lack of Transportation (Medical): No  . Lack of Transportation (Non-Medical): No  Physical Activity:   . Days of Exercise per Week:   . Minutes of Exercise per Session:   Stress:   . Feeling of Stress :   Social Connections:   . Frequency of Communication with Friends and Family:   . Frequency of Social Gatherings with Friends and Family:   . Attends Religious Services:   . Active Member of Clubs or Organizations:   . Attends Banker Meetings:   Marland Kitchen Marital Status:   Intimate Partner Violence:   . Fear of  Current or Ex-Partner:   . Emotionally Abused:   Marland Kitchen Physically Abused:   . Sexually Abused:     Family History  Problem Relation Age of Onset  . Diabetes Mother   . Hypertension Father     Past Surgical History:  Procedure Laterality Date  . CESAREAN SECTION    . TUBAL LIGATION      ROS: Review of Systems Negative except as stated above  PHYSICAL EXAM: BP 134/85   Pulse 66   Temp 97.9 F (36.6 C)   Resp 16   Wt 169 lb 9.6 oz (76.9 kg)   SpO2 99%   BMI 32.58 kg/m   Wt Readings from Last 3 Encounters:  07/22/19 169 lb 9.6 oz (76.9 kg)  01/30/19 176 lb (79.8 kg)  11/16/18 183 lb 12.8 oz (83.4  kg)    Physical Exam  General appearance - alert, well appearing, and in no distress Mental status - normal mood, behavior, speech, dress, motor activity, and thought processes Breasts -CMA Pollock present for breast and pelvic exam :breasts appear normal, no suspicious masses, no skin or nipple changes or axillary nodes Pelvic - normal external genitalia, vulva, vagina, cervix, uterus and adnexa  Results for orders placed or performed in visit on 07/22/19  POCT glucose (manual entry)  Result Value Ref Range   POC Glucose 90 70 - 99 mg/dl  POCT glycosylated hemoglobin (Hb A1C)  Result Value Ref Range   Hemoglobin A1C     HbA1c POC (<> result, manual entry)     HbA1c, POC (prediabetic range) 5.4 (A) 5.7 - 6.4 %   HbA1c, POC (controlled diabetic range)       Depression screen [facility] 2/9 07/22/2019 10/17/2017 10/13/2016  Decreased Interest 0 0 0  Down, Depressed, Hopeless 0 0 0  PHQ - 2 Score 0 0 0  Altered sleeping - - 1  Tired, decreased energy - - 0  Change in appetite - - 0  Feeling bad or failure about yourself  - - 0  Trouble concentrating - - 0  Moving slowly or fidgety/restless - - 0  Suicidal thoughts - - 0  PHQ-9 Score - - 1  Difficult doing work/chores - - -     CMP Latest Ref Rng & Units 11/16/2018 10/13/2016 08/06/2015  Glucose 65 - 99 mg/dL 90 - 92  BUN 6 - 24 mg/dL 11 - 15  Creatinine 0.57 - 1.00 mg/dL 0.58 - 0.54  Sodium 134 - 144 mmol/L 138 - 136  Potassium 3.5 - 5.2 mmol/L 4.3 - 4.5  Chloride 96 - 106 mmol/L 103 - 104  CO2 20 - 29 mmol/L 22 - 24  Calcium 8.7 - 10.2 mg/dL 9.1 - 8.7  Total Protein 6.0 - 8.5 g/dL 6.8 6.9 6.8  Total Bilirubin 0.0 - 1.2 mg/dL 0.2 0.2 0.4  Alkaline Phos 39 - 117 IU/L 81 68 54  AST 0 - 40 IU/L 18 23 26   ALT 0 - 32 IU/L 19 15 24    Lipid Panel     Component Value Date/Time   CHOL 186 11/16/2018 1146   TRIG 128 11/16/2018 1146   HDL 56 11/16/2018 1146   CHOLHDL 3.3 11/16/2018 1146   CHOLHDL 3.1 12/26/2014 0910   VLDL 14  12/26/2014 0910   LDLCALC 104 (H) 11/16/2018 1146    CBC    Component Value Date/Time   WBC 7.0 11/16/2018 1146   WBC 4.9 01/13/2016 1140   RBC 4.85 11/16/2018 1146   RBC 4.76  01/13/2016 1140   HGB 12.0 11/16/2018 1146   HCT 38.3 11/16/2018 1146   PLT 268 11/16/2018 1146   MCV 79 11/16/2018 1146   MCH 24.7 (L) 11/16/2018 1146   MCH 28.6 01/13/2016 1140   MCHC 31.3 (L) 11/16/2018 1146   MCHC 32.2 01/13/2016 1140   RDW 15.1 11/16/2018 1146   LYMPHSABS 1,666 01/13/2016 1140   MONOABS 245 01/13/2016 1140   EOSABS 98 01/13/2016 1140   BASOSABS 0 01/13/2016 1140    ASSESSMENT AND PLAN: 1. Pap smear for cervical cancer screening - Cervicovaginal ancillary only - Cytology - PAP  2. Prediabetes Blood sugar and A1c at this time are normal. - POCT glucose (manual entry) - POCT glycosylated hemoglobin (Hb A1C)  3. Obesity (BMI 30.0-34.9) Discussed on encourage healthy eating habits and regular exercise.  Encouraged her to get in some form of moderate intensity exercise with goal of at least 150 minutes total per week  4. Elevated blood pressure reading without diagnosis of hypertension DASH diet discussed and encouraged.    Patient was given the opportunity to ask questions.  Patient verbalized understanding of the plan and was able to repeat key elements of the plan.  [employer] interpreter used during this encounter. #[id number]  Orders Placed This Encounter  Procedures  . POCT glucose (manual entry)  . POCT glycosylated hemoglobin (Hb A1C)     Requested Prescriptions    No prescriptions requested or ordered in this encounter    Return if symptoms worsen or fail to improve.  Jonah Blue, MD, FACP

## 2019-07-23 ENCOUNTER — Other Ambulatory Visit: Payer: Self-pay | Admitting: Internal Medicine

## 2019-07-23 LAB — CYTOLOGY - PAP
Comment: NEGATIVE
Diagnosis: NEGATIVE
High risk HPV: NEGATIVE

## 2019-07-23 LAB — CERVICOVAGINAL ANCILLARY ONLY
Bacterial Vaginitis (gardnerella): POSITIVE — AB
Candida Glabrata: NEGATIVE
Candida Vaginitis: NEGATIVE
Chlamydia: NEGATIVE
Comment: NEGATIVE
Comment: NEGATIVE
Comment: NEGATIVE
Comment: NEGATIVE
Comment: NEGATIVE
Comment: NORMAL
Neisseria Gonorrhea: NEGATIVE
Trichomonas: NEGATIVE

## 2019-07-23 MED ORDER — METRONIDAZOLE 500 MG PO TABS: 500.0000 mg | ORAL_TABLET | Freq: Two times a day (BID) | ORAL | 0 refills | Status: DC

## 2019-07-23 MED FILL — metroNIDAZOLE 500 MG TABS: 500 | 7 days supply | Qty: 14 | Fill #0

## 2019-07-24 ENCOUNTER — Telehealth: Payer: Self-pay

## 2019-07-24 NOTE — Telephone Encounter (Signed)
[employer] interpre[employer] Myra  Id# [phone]  contacted pt to go over pap results pt is aware and doesn't have any questions or concerns

## 2019-12-26 ENCOUNTER — Ambulatory Visit: Payer: No Typology Code available for payment source | Admitting: Physician Assistant

## 2020-01-08 ENCOUNTER — Ambulatory Visit: Payer: Self-pay | Attending: Internal Medicine

## 2020-01-08 ENCOUNTER — Other Ambulatory Visit: Payer: Self-pay

## 2020-01-31 ENCOUNTER — Encounter (HOSPITAL_COMMUNITY): Payer: Self-pay

## 2020-02-03 ENCOUNTER — Other Ambulatory Visit: Payer: Self-pay | Admitting: Internal Medicine

## 2020-02-03 ENCOUNTER — Ambulatory Visit (HOSPITAL_BASED_OUTPATIENT_CLINIC_OR_DEPARTMENT_OTHER): Payer: Self-pay | Admitting: Pharmacist

## 2020-02-03 ENCOUNTER — Encounter: Payer: Self-pay | Admitting: Internal Medicine

## 2020-02-03 ENCOUNTER — Other Ambulatory Visit: Payer: Self-pay

## 2020-02-03 ENCOUNTER — Ambulatory Visit: Payer: Self-pay | Attending: Internal Medicine | Admitting: Internal Medicine

## 2020-02-03 VITALS — BP 140/88 | HR 72 | Resp 16 | Ht 60.5 in | Wt 189.2 lb

## 2020-02-03 DIAGNOSIS — Z23 Encounter for immunization: Secondary | ICD-10-CM

## 2020-02-03 DIAGNOSIS — E669 Obesity, unspecified: Secondary | ICD-10-CM

## 2020-02-03 DIAGNOSIS — H6123 Impacted cerumen, bilateral: Secondary | ICD-10-CM

## 2020-02-03 DIAGNOSIS — M25561 Pain in right knee: Secondary | ICD-10-CM

## 2020-02-03 DIAGNOSIS — I10 Essential (primary) hypertension: Secondary | ICD-10-CM | POA: Insufficient documentation

## 2020-02-03 DIAGNOSIS — R7303 Prediabetes: Secondary | ICD-10-CM

## 2020-02-03 MED ORDER — AMLODIPINE BESYLATE 5 MG PO TABS: 5.0000 mg | ORAL_TABLET | Freq: Every day | ORAL | 1 refills | Status: DC

## 2020-02-03 MED ORDER — DICLOFENAC SODIUM 1 % EX GEL: 2.0000 g | Freq: Four times a day (QID) | CUTANEOUS | 0 refills | Status: DC

## 2020-02-03 MED FILL — DICLOFENAC SODIUM 1% GEL: 1 | 12 days supply | Qty: 100 | Fill #0

## 2020-02-03 MED FILL — AMLODIPINE BESYLATE 5 MG TA: 5 | 30 days supply | Qty: 30 | Fill #0

## 2020-02-03 NOTE — Patient Instructions (Addendum)
Influenza Virus Vaccine injection (Fluarix) Qu es este medicamento? La VACUNA ANTIGRIPAL ayuda a disminuir el riesgo de contraer la influenza, tambin conocida como la gripe. La vacuna solo ayuda a protegerle contra algunas cepas de influenza. Esta vacuna no ayuda a reducir Catering manager de contraer influenza pandmica H1N1. Este medicamento puede ser utilizado para otros usos; si tiene alguna pregunta consulte con su proveedor de atencin mdica o con su farmacutico. MARCAS COMUNES: Fluarix, Fluzone Qu le debo informar a mi profesional de la salud antes de tomar este medicamento? Necesita saber si usted presenta alguno de los siguientes problemas o situaciones:  trastorno de sangrado como hemofilia  fiebre o infeccin  sndrome de Guillain-Barre u otros problemas neurolgicos  problemas del sistema inmunolgico  infeccin por el virus de la inmunodeficiencia humana (VIH) o SIDA  niveles bajos de plaquetas en la sangre  esclerosis mltiple  una reaccin IT consultant o inusual a las vacunas antigripales, a los huevos, protenas de pollo, al ltex, a la gentamicina, a otros medicamentos, alimentos, colorantes o conservantes  si est embarazada o buscando quedar embarazada  si est amamantando a un beb Cmo debo [employer]? Esta vacuna se administra mediante inyeccin por va intramuscular. Lo administra un profesional de [employer]	[place]. Recibir una copia de informacin escrita sobre la vacuna antes de cada vacuna. Asegrese de leer este folleto cada vez cuidadosamente. Este folleto puede cambiar con frecuencia. Hable con su pediatra para informarse acerca del uso de este medicamento en nios. Puede requerir atencin especial. Sobredosis: Pngase en contacto inmediatamente con un centro toxicolgico o una sala de urgencia si usted cree que haya tomado demasiado medicamento. ATENCIN: [employer] es solo para usted. No comparta este medicamento con nadie. Qu sucede si me  olvido de una dosis? No se aplica en este caso. Qu puede interactuar con este medicamento?  quimioterapia o radioterapia  medicamentos que suprimen el sistema inmunolgico, tales como etanercept, anakinra, infliximab y adalimumab  medicamentos que tratan o previenen cogulos sanguneos, como warfarina  fenitona  medicamentos esteroideos, como la prednisona o la cortisona  teofilina  vacunas Puede ser que esta lista no menciona todas las posibles interacciones. Informe a su profesional de [employer]	[place] de [employer] productos a base de hierbas, medicamentos de Benjamin Perez o suplementos nutritivos que est tomando. Si usted fuma, consume bebidas alcohlicas o si utiliza drogas ilegales, indqueselo tambin a su profesional de [employer]	[place]. Algunas sustancias pueden interactuar con su medicamento. A qu debo estar atento al usar [employer]? Informe a su mdico o a Barrister's clerk de la [employer] todos los efectos secundarios que persistan despus de 3 das. Llame a su proveedor de atencin mdica si se presentan sntomas inusuales dentro de las 6 semanas posteriores a la vacunacin. Es posible que todava pueda contraer la gripe, pero la enfermedad no ser tan fuerte como normalmente. No puede contraer la gripe de esta vacuna. La vacuna antigripal no le protege contra resfros u otras enfermedades que pueden causar [place]. Debe vacunarse cada ao. Qu efectos secundarios puedo tener al [employer] este medicamento? Efectos secundarios que debe informar a su mdico o a Barrister's clerk de la salud tan pronto como sea posible:  Chief of Staff como erupcin cutnea, picazn o urticarias, hinchazn de la cara, labios o lengua Efectos secundarios que, por lo general, no requieren atencin mdica (debe informarlos a su mdico o a su profesional de la salud si persisten o si son molestos):  fiebre  dolor de cabeza  molestias y dolores musculares  dolor, sensibilidad, enrojecimiento o  hinchazn en el lugar de la inyeccin  cansancio o debilidad Puede ser que esta lista no menciona todos los posibles efectos secundarios. Comunquese a su mdico por asesoramiento mdico [employer]. Usted puede informar los efectos secundarios a la FDA por telfono al 1-800-FDA-1088. Dnde debo guardar mi medicina? Esta vacuna se administra solamente en clnicas, farmacias, consultorio mdico u otro consultorio de un profesional de la salud y no Teacher, early years/pre en su domicilio. ATENCIN: Este folleto es un resumen. Puede ser que no cubra toda la posible informacin. Si usted tiene preguntas acerca de esta medicina, consulte con su mdico, su farmacutico o su profesional de Radiographer, therapeutic.  2020 Elsevier/Gold Standard (2009-09-08 15:31:40)  Hipertensin en los adultos Hypertension, Adult La presin arterial alta (hipertensin) se produce cuando la fuerza de la sangre bombea a travs de las arterias con mucha fuerza. Las arterias son los vasos sanguneos que transportan la sangre desde el corazn al resto del cuerpo. La hipertensin hace que el corazn haga ms esfuerzo para Insurance account manager y [employer] Corporation que las arterias se Armed forces training and education officer o Multimedia programmer. La hipertensin no tratada o no controlada puede causar infarto de miocardio, insuficiencia cardaca, accidente cerebrovascular, enfermedad renal y otros problemas. Una lectura de la presin arterial consta de un nmero ms alto sobre un nmero ms bajo. En condiciones ideales, la presin arterial debe estar por debajo de 120/80. El primer nmero ("superior") es la presin sistlica. Es la medida de la presin de las arterias cuando el corazn late. El segundo nmero ("inferior") es la presin diastlica. Es la medida de la presin en las arterias cuando el corazn se relaja. Cules son las causas? Se desconoce la causa exacta de esta afeccin. Hay algunas afecciones que causan presin arterial alta o estn relacionadas con ella. Qu  incrementa el riesgo? Algunos factores de riesgo de hipertensin estn bajo su control. Los siguientes factores pueden hacer que sea ms propenso a Aeronautical engineer afeccin:  Fumar.  Tener diabetes mellitus tipo 2, colesterol alto, o ambos.  No hacer la cantidad suficiente de actividad fsica o ejercicio.  Tener sobrepeso.  Consumir mucha grasa, azcar, caloras o sal (sodio) en su dieta.  Beber alcohol en exceso. Algunos factores de riesgo para la presin arterial alta pueden ser difciles o imposibles de Multimedia programmer. Algunos de estos factores son los siguientes:  Tener enfermedad renal crnica.  Tener antecedentes familiares de presin arterial alta.  Edad. Los riesgos aumentan con la edad.  Raza. El riesgo es mayor para las Statistician.  Sexo. Antes de los 45aos, los hombres corren ms [employer]. Despus de los 65aos, las mujeres corren ms [employer].  Tener apnea obstructiva del sueo.  Estrs. Cules son los signos o los sntomas? Es posible que la presin arterial alta puede no cause sntomas. La presin arterial muy alta (crisis hipertensiva) puede provocar:  Dolor de cabeza.  Ansiedad.  Falta de aire.  Hemorragia nasal.  Nuseas y vmitos.  Cambios en la visin.  Dolor de pecho intenso.  Convulsiones. Cmo se diagnostica? Esta afeccin se diagnostica al medir su presin arterial mientras se encuentra sentado, con el brazo apoyado sobre una superficie plana, las piernas sin cruzar y los pies bien apoyados en el piso. El brazalete del tensimetro debe colocarse directamente sobre la piel de la parte superior del brazo y al nivel de su corazn. Debe medirla al [facility] veces en el mismo brazo. Determinadas condiciones pueden causar una diferencia de  presin arterial entre el brazo izquierdo y Aeronautical engineer. Ciertos factores pueden provocar que las lecturas de la presin arterial sean inferiores o superiores a lo normal por  un perodo corto de tiempo:  Si su presin arterial es ms alta cuando se encuentra en el consultorio del mdico que cuando la mide en su hogar, se denomina "hipertensin de bata blanca". La Harley[employer]Davidson de las personas que tienen esta afeccin no deben ser [employer].  Si su presin arterial es ms alta en el hogar que cuando se encuentra en el consultorio del mdico, se denomina "hipertensin enmascarada". La Harley[employer]son de las personas que tienen esta afeccin deben ser medicadas para Chief Operating Officer la presin arterial. Si tiene una lecturas de presin arterial alta durante una visita o si tiene presin arterial normal con otros factores de riesgo, se le podr pedir que haga lo siguiente:  Que regrese otro da para volver a Chief Operating Officer su presin arterial nuevamente.  Que se controle la presin arterial en su casa durante 1 semana o ms. Si se le diagnostica hipertensin, es posible que se le realicen otros anlisis de sangre o estudios de diagnstico por imgenes para ayudar a su mdico a comprender su riesgo general de tener otras afecciones. Cmo se trata? Esta afeccin se trata haciendo cambios saludables en el estilo de vida, tales como ingerir alimentos saludables, realizar ms ejercicio y reducir el consumo de alcohol. El mdico puede recetarle medicamentos si los cambios en el estilo de vida no son suficientes para Museum/gallery curator la presin arterial y si:  Su presin arterial sistlica est por encima de 130.  Su presin arterial diastlica est por encima de 80. La presin arterial deseada puede variar en funcin de las enfermedades, la edad y otros factores personales. Siga estas instrucciones en su casa: Comida y bebida   Siga una dieta con alto contenido de fibras y [place], y con bajo contenido de sodio, International aid/development worker agregada y Neurosurgeon. Un ejemplo de plan alimenticio es la dieta DASH (Dietary Approaches to Stop Hypertension, Mtodos alimenticios para detener la hipertensin). Para alimentarse de  esta manera: ? Coma mucha fruta y verdura fresca. Trate de que la mitad del plato de cada comida sea de frutas y verduras. ? Coma cereales integrales, como pasta integral, arroz integral o pan integral. Llene aproximadamente un cuarto del plato con cereales integrales. ? Coma y beba productos lcteos con bajo contenido de grasa, como leche descremada o yogur bajo en grasas. ? Evite la ingesta de cortes de carne grasa, carne procesada o curada, y carne de ave con piel. Llene aproximadamente un cuarto del plato con protenas magras, como pescado, pollo sin piel, frijoles, huevos o tofu. ? Evite ingerir alimentos prehechos y procesados. En general, estos tienen mayor cantidad de sodio, azcar agregada y Steffanie [place].  Reduzca su ingesta diaria de sodio. La mayora de las personas que tienen hipertensin deben comer menos de 1500 mg de sodio por [employer].  No beba alcohol si: ? Su mdico le indica no hacerlo. ? Est embarazada, puede estar embarazada o est tratando de quedar embarazada.  Si bebe alcohol: ? Limite la cantidad que bebe a lo siguiente:  De 0 a 1 medida por da para las mujeres.  De 0 a 2 medidas por da para los hombres. ? Est atento a la cantidad de alcohol que hay en las bebidas que toma. En los [address], una medida equivale a una botella de cerveza de 12oz ( ), un vaso de vino de 5oz ( ) o un vaso de  una bebida alcohlica de alta graduacin de 1oz (81ml). Estilo de vida   Trabaje con su mdico para mantener un peso saludable o Curator. Pregntele cul es el peso recomendado para usted.  Haga al menos de ejercicio la DIRECTV de la [place]. Estas actividades pueden incluir caminar, nadar o andar en bicicleta.  Incluya ejercicios para fortalecer sus msculos (ejercicios de resistencia), como Pilates o levantamiento de pesas, como parte de su rutina semanal de ejercicios. Intente realizar de este tipo de ejercicios al [employer]ogg  a la [place].  No consuma ningn producto que contenga nicotina o tabaco, como cigarrillos, cigarrillos electrnicos y tabaco de Theatre manager. Si necesita ayuda para dejar de fumar, consulte al mdico.  Contrlese la presin arterial en su casa segn las indicaciones del mdico.  Concurra a todas las visitas de seguimiento como se lo haya indicado el mdico. Esto es importante. Medicamentos  [employer] de venta libre y los recetados solamente como se lo haya indicado el mdico. Siga cuidadosamente las indicaciones. Los medicamentos para la presin arterial deben tomarse segn las indicaciones.  No omita las dosis de medicamentos para la presin arterial. Si lo hace, estar en riesgo de tener problemas y puede hacer que los medicamentos sean menos eficaces.  Pregntele a su mdico a qu efectos secundarios o reacciones a los Museum/gallery curator. Comunquese con un mdico si:  Piensa que tiene una reaccin a un medicamento que est tomando.  Tiene dolores de cabeza frecuentes (recurrentes).  Se siente mareado.  Tiene hinchazn en los tobillos.  Tiene problemas de visin. Solicite ayuda inmediatamente si:  Siente un dolor de cabeza intenso o confusin.  Siente debilidad inusual o adormecimiento.  Siente que va a desmayarse.  Siente un dolor intenso en el pecho o el abdomen.  Vomita repetidas veces.  Tiene dificultad para respirar. Resumen  La hipertensin se produce cuando la sangre bombea en las arterias con mucha fuerza. Si esta afeccin no se controla, podra correr riesgo de tener complicaciones graves.  La presin arterial deseada puede variar en funcin de las enfermedades, la edad y otros factores personales. Para la [employer], una presin arterial normal es menor que 120/80.  La hipertensin se trata con cambios en el estilo de vida, medicamentos o una combinacin de [place]. Los [employer] estilo de vida incluyen prdida de peso,  ingerir alimentos sanos, seguir una dieta baja en sodio, hacer ms ejercicio y Glass blower/designer consumo de alcohol. Esta informacin no tiene Theme park manager el consejo del mdico. Asegrese de hacerle al mdico cualquier pregunta que tenga. Document Revised: 12/21/2017 Document Reviewed: 12/21/2017 Elsevier Patient Education  2020 [employer].

## 2020-02-03 NOTE — Progress Notes (Signed)
Patient ID: Sharon Richardson, female    DOB: 09-04-1970  MRN: [id number]  CC: Routine follow-up visit.  Subjective: Sharon Richardson is a 49 y.o. female who presents for routine f/u visit Her concerns today include:  Pt with hx of obesity, preDM  Pt c/o cramping like pain in RT knee.  Feels "like when something is about to pop."  More noticeable when sitting. No swelling.  No initiating factor.  Gained 20 lbs since last visit with me in May. Does not exercise and eats a lot of white stuff.  Drinks soda and coffee with sugar.   Not getting in much exercise.  Just started doing Zumba 2 days a wk for last 2 wks  BP elevated noted on last 2.  No device to check BP No CP.  Some SOB which she atrributes to wgh gain Patient Active Problem List   Diagnosis Date Noted  . Screening breast examination 01/30/2019  . Elevated blood pressure reading 11/16/2018  . Obesity (BMI 35.0-39.9 without comorbidity) 11/16/2018  . ANEMIA, IRON DEFICIENCY 06/10/2009  . OTHER GENERAL SYMPTOMS 06/04/2009  . Acute upper respiratory infection 02/05/2009  . ACUTE SINUSITIS, UNSPECIFIED 03/03/2008  . CERUMEN IMPACTION, BILATERAL 02/18/2008     No current outpatient medications on file prior to visit.   No current facility-administered medications on file prior to visit.    No Known Allergies  Social History   Socioeconomic History  . Marital status: Married    Spouse name: Not on file  . Number of children: Not on file  . Years of education: Not on file  . Highest education level: 9th grade  Occupational History  . Not on file  Tobacco Use  . Smoking status: Never Smoker  . Smokeless tobacco: Never Used  Vaping Use  . Vaping Use: Never used  Substance and Sexual Activity  . Alcohol use: No  . Drug use: No  . Sexual activity: Yes    Birth control/protection: Surgical  Other Topics Concern  . Not on file  Social History Narrative  . Not on file   Social Determinants  of Health   Financial Resource Strain:   . Difficulty of Paying Living Expenses: Not on file  Food Insecurity:   . Worried About Programme researcher, broadcasting/film/video in the Last Year: Not on file  . Ran Out of Food in the Last Year: Not on file  Transportation Needs:   . Lack of Transportation (Medical): Not on file  . Lack of Transportation (Non-Medical): Not on file  Physical Activity:   . Days of Exercise per Week: Not on file  . Minutes of Exercise per Session: Not on file  Stress:   . Feeling of Stress : Not on file  Social Connections:   . Frequency of Communication with Friends and Family: Not on file  . Frequency of Social Gatherings with Friends and Family: Not on file  . Attends Religious Services: Not on file  . Active Member of Clubs or Organizations: Not on file  . Attends Banker Meetings: Not on file  . Marital Status: Not on file  Intimate Partner Violence:   . Fear of Current or Ex-Partner: Not on file  . Emotionally Abused: Not on file  . Physically Abused: Not on file  . Sexually Abused: Not on file    Family History  Problem Relation Age of Onset  . Diabetes Mother   . Hypertension Father     Past Surgical History:  Procedure  Laterality Date  . CESAREAN SECTION    . TUBAL LIGATION      ROS: Review of Systems Negative except as stated above  PHYSICAL EXAM: BP 140/88   Pulse 72   Resp 16   Ht 5' 0.5" (1.537 m)   Wt 189 lb 3.2 oz (85.8 kg)   SpO2 99%   BMI 36.34 kg/m   Wt Readings from Last 3 Encounters:  02/03/20 189 lb 3.2 oz (85.8 kg)  07/22/19 169 lb 9.6 oz (76.9 kg)  01/30/19 176 lb (79.8 kg)    Physical Exam  General appearance - alert, well appearing, obese middle-aged Hispanic female and in no distress Mental status - normal mood, behavior, speech, dress, motor activity, and thought processes Eyes - pupils equal and reactive, extraocular eye movements intact Ears -small amount of impacted wax in both ear partially obscuring view of  tympanic membrane. Nose - normal and patent, no erythema, discharge or polyps Mouth - mucous membranes moist, pharynx normal without lesions Neck - supple, no significant adenopathy Chest - clear to auscultation, no wheezes, rales or rhonchi, symmetric air entry Heart - normal rate, regular rhythm, normal S1, S2, no murmurs, rubs, clicks or gallops Musculoskeletal -right knee: Large body habitus.  No tenderness on palpation of the medial or lateral joint lines.  Good range of motion.   Extremities -no lower extremity edema.  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   CMP Latest Ref Rng & Units 11/16/2018 10/13/2016 08/06/2015  Glucose 65 - 99 mg/dL 90 - 92  BUN 6 - 24 mg/dL 11 - 15  Creatinine 2.95 - 1.00 mg/dL 2.84 - 1.32  Sodium 440 - 144 mmol/L 138 - 136  Potassium 3.5 - 5.2 mmol/L 4.3 - 4.5  Chloride 96 - 106 mmol/L 103 - 104  CO2 20 - 29 mmol/L 22 - 24  Calcium 8.7 - 10.2 mg/dL 9.1 - 8.7  Total Protein 6.0 - 8.5 g/dL 6.8 6.9 6.8  Total Bilirubin 0.0 - 1.2 mg/dL 0.2 0.2 0.4  Alkaline Phos 39 - 117 IU/L 81 68 54  AST 0 - 40 IU/L 18 23 26   ALT 0 - 32 IU/L 19 15 24    Lipid Panel     Component Value Date/Time   CHOL 186 11/16/2018 1146   TRIG 128 11/16/2018 1146   HDL 56 11/16/2018 1146   CHOLHDL 3.3 11/16/2018 1146   CHOLHDL 3.1 12/26/2014 0910   VLDL 14 12/26/2014 0910   LDLCALC 104 (H) 11/16/2018 1146    CBC    Component Value Date/Time   WBC 7.0 11/16/2018 1146   WBC 4.9 01/13/2016 1140   RBC 4.85 11/16/2018 1146   RBC 4.76 01/13/2016 1140   HGB 12.0 11/16/2018 1146   HCT 38.3 11/16/2018 1146   PLT 268 11/16/2018 1146   MCV 79 11/16/2018 1146   MCH 24.7 (L) 11/16/2018 1146   MCH 28.6 01/13/2016 1140   MCHC 31.3 (L) 11/16/2018 1146   MCHC 32.2 01/13/2016 1140   RDW 15.1 11/16/2018 1146   LYMPHSABS 1,666 01/13/2016 1140   MONOABS 245 01/13/2016 1140   EOSABS 98 01/13/2016 1140   BASOSABS 0 01/13/2016 1140    ASSESSMENT AND PLAN: 1. Essential  hypertension Repeat blood pressure today still elevated.  DASH diet discussed and encouraged.  Start amlodipine.  Follow-up with clinical pharmacist in 3 weeks for repeat blood pressure check. - CBC - Comprehensive metabolic panel  2. Obesity (BMI 35.0-39.9 without comorbidity) Discussed and encourage healthy eating  habits. Encourage some form of moderate intensity exercise at least 3 to 4 days a week for 30 minutes. - Lipid panel - Hemoglobin A1c  3. Prediabetes See #2 above  4. Bilateral impacted cerumen Recommend purchasing some wax softener over-the-counter and using  5. Acute pain of right knee Exam today is benign.  However I strongly encourage weight loss.  Voltaren gel to use as needed.  6. Need for influenza vaccination Given.    Patient was given the opportunity to ask questions.  Patient verbalized understanding of the plan and was able to repeat key elements of the plan.  [employer] interpreter used during this encounter. [id number]  Orders Placed This Encounter  Procedures  . CBC  . Comprehensive metabolic panel  . Lipid panel  . Hemoglobin A1c     Requested Prescriptions   Signed Prescriptions Disp Refills  . amLODipine (NORVASC) 5 MG tablet 90 tablet 1    Sig: Take 1 tablet (5 mg total) by mouth daily.  . diclofenac Sodium (VOLTAREN) 1 % GEL 100 g 0    Sig: Apply 2 g topically 4 (four) times daily.    Return in about 4 months (around 06/02/2020) for Give appt with [facility] in 3 wks for repeat BP check. Jonah Blue, MD, FACP

## 2020-02-03 NOTE — Progress Notes (Signed)
Patient presents for vaccination against influenza per orders of Dr. Johnson. Consent given. Counseling provided. No contraindications exists. Vaccine administered without incident.  ° °Luke Van Ausdall, PharmD, CPP °Clinical Pharmacist °[facility] °[phone] ° °

## 2020-02-04 LAB — CBC
Hematocrit: 40 % (ref 34.0–46.6)
Hemoglobin: 13.2 g/dL (ref 11.1–15.9)
MCH: 28.1 pg (ref 26.6–33.0)
MCHC: 33 g/dL (ref 31.5–35.7)
MCV: 85 fL (ref 79–97)
Platelets: 213 10*3/uL (ref 150–450)
RBC: 4.69 x10E6/uL (ref 3.77–5.28)
RDW: 13.7 % (ref 11.7–15.4)
WBC: 7.1 10*3/uL (ref 3.4–10.8)

## 2020-02-04 LAB — COMPREHENSIVE METABOLIC PANEL
ALT: 20 IU/L (ref 0–32)
AST: 14 IU/L (ref 0–40)
Albumin/Globulin Ratio: 1.5 (ref 1.2–2.2)
Albumin: 4.4 g/dL (ref 3.8–4.8)
Alkaline Phosphatase: 90 IU/L (ref 44–121)
BUN/Creatinine Ratio: 28 — ABNORMAL HIGH (ref 9–23)
BUN: 16 mg/dL (ref 6–24)
Bilirubin Total: <0.2 mg/dL (ref 0.0–1.2)
CO2: 25 mmol/L (ref 20–29)
Calcium: 9.5 mg/dL (ref 8.7–10.2)
Chloride: 103 mmol/L (ref 96–106)
Creatinine, Ser: 0.57 mg/dL (ref 0.57–1.00)
GFR calc Af Amer: 126 mL/min/{1.73_m2} (ref >59)
GFR calc non Af Amer: 109 mL/min/{1.73_m2} (ref >59)
Globulin, Total: 3 g/dL (ref 1.5–4.5)
Glucose: 96 mg/dL (ref 65–99)
Potassium: 4.2 mmol/L (ref 3.5–5.2)
Sodium: 140 mmol/L (ref 134–144)
Total Protein: 7.4 g/dL (ref 6.0–8.5)

## 2020-02-04 LAB — LIPID PANEL
Chol/HDL Ratio: 4.3 ratio (ref 0.0–4.4)
Cholesterol, Total: 230 mg/dL — ABNORMAL HIGH (ref 100–199)
HDL: 54 mg/dL (ref >39)
LDL Chol Calc (NIH): 144 mg/dL — ABNORMAL HIGH (ref 0–99)
Triglycerides: 181 mg/dL — ABNORMAL HIGH (ref 0–149)
VLDL Cholesterol Cal: 32 mg/dL (ref 5–40)

## 2020-02-04 LAB — HEMOGLOBIN A1C
Est. average glucose Bld gHb Est-mCnc: 120 mg/dL
Hgb A1c MFr Bld: 5.8 % — ABNORMAL HIGH (ref 4.8–5.6)

## 2020-02-05 ENCOUNTER — Telehealth: Payer: Self-pay

## 2020-02-05 NOTE — Progress Notes (Signed)
Let patient know that her blood cell counts are normal.  This means that her red blood cell, white blood cell and platelet counts are all normal.  Kidney and liver function tests are normal.  LDL cholesterol is 114 with goal being less than 100.  She is still in the range for prediabetes.  Healthy eating habits and regular exercise will help to lower cholesterol and prevent progression to full diabetes.

## 2020-02-05 NOTE — Telephone Encounter (Signed)
[employer] Reuel Boom  Id# [id number]  contacted pt to go over lab results pt didn't answer lvm

## 2020-02-26 ENCOUNTER — Encounter: Payer: Self-pay | Admitting: Pharmacist

## 2020-02-26 ENCOUNTER — Other Ambulatory Visit: Payer: Self-pay

## 2020-02-26 ENCOUNTER — Ambulatory Visit: Payer: Self-pay | Attending: Internal Medicine | Admitting: Pharmacist

## 2020-02-26 VITALS — BP 116/74

## 2020-02-26 DIAGNOSIS — I10 Essential (primary) hypertension: Secondary | ICD-10-CM

## 2020-02-26 NOTE — Progress Notes (Signed)
   S:    PCP: Dr. Laural Benes   Patient arrives in good spirits. Presents to the clinic for hypertension evaluation, counseling, and management. Patient was referred and last seen by Primary Care Provider on 02/03/20. BP at that visit 140/88.    Medication adherence reported.  Current BP Medications include:  Amlodipine 5 mg daily   Dietary habits include: compliant with salt restriction; denies caffeine intake  Exercise habits include: Zumba 2x/wk; recently enrolled in a gym membership and exercises most days of the week  Family / Social history:  - FHx: DM, HTN - Tobacco: never smoker  - Alcohol: denies use   O:  Vitals:   02/26/20 1459  BP: 116/74   Home BP readings: none   Last 3 Office BP readings: BP Readings from Last 3 Encounters:  02/03/20 140/88  07/22/19 134/85  01/30/19 126/76   BMET    Component Value Date/Time   NA 140 02/03/2020 1550   K 4.2 02/03/2020 1550   CL 103 02/03/2020 1550   CO2 25 02/03/2020 1550   GLUCOSE 96 02/03/2020 1550   GLUCOSE 92 08/06/2015 1038   BUN 16 02/03/2020 1550   CREATININE 0.57 02/03/2020 1550   CREATININE 0.54 08/06/2015 1038   CALCIUM 9.5 02/03/2020 1550   GFRNONAA 109 02/03/2020 1550   GFRNONAA >89 12/26/2014 0910   GFRAA 126 02/03/2020 1550   GFRAA >89 12/26/2014 0910    Renal function: CrCl cannot be calculated (Patient's most recent lab result is older than the maximum 21 days allowed.).  Clinical ASCVD: No  The 10-year ASCVD risk score Denman George DC Jr., et al., 2013) is: 2.3%   Values used to calculate the score:     Age: 49 years     Sex: Female     Is Non-Hispanic African American: No     Diabetic: No     Tobacco smoker: No     Systolic Blood Pressure: 140 mmHg     Is BP treated: Yes     HDL Cholesterol: 54 mg/dL     Total Cholesterol: 230 mg/dL   A/P: Hypertension newly dx currently controlled on current medications. BP Goal = < 130/80 mmHg. Medication adherence reported.  -Continued current medications.   -Counseled on lifestyle modifications for blood pressure control including reduced dietary sodium, increased exercise, adequate sleep.  Results reviewed and written information provided.   Total time in face-to-face counseling 15 minutes.   F/U Clinic Visit with PCP.   Butch Penny, PharmD, Patsy [place], CPP Clinical Pharmacist [facility] [phone]

## 2020-03-09 MED FILL — AMLODIPINE BESYLATE 5 MG TA: 5 | 30 days supply | Qty: 30 | Fill #1

## 2020-03-30 ENCOUNTER — Telehealth: Payer: Self-pay | Admitting: Physician Assistant

## 2020-03-30 DIAGNOSIS — E559 Vitamin D deficiency, unspecified: Secondary | ICD-10-CM

## 2020-03-30 DIAGNOSIS — G44209 Tension-type headache, unspecified, not intractable: Secondary | ICD-10-CM | POA: Insufficient documentation

## 2020-03-30 NOTE — Progress Notes (Signed)
Patient verified DOB Patient complains of HA being present and tylenol providing minimal relief. Patient shares HA was intense yesterday causing nausea. Patient shares when she does not eat she gets HAs of this nature

## 2020-03-30 NOTE — Progress Notes (Signed)
Established Patient Office Visit  Subjective:  Patient ID: Sharon Richardson, female    DOB: Jan 13, 1971  Age: 50 y.o. MRN: [id number]  CC:  Chief Complaint  Patient presents with  . Headache   Virtual Visit via Telephone Note  I connected with Sharon Richardson on 03/30/20 at  3:20 PM EST by telephone and verified that I am speaking with the correct person using two identifiers.  Location: Patient: Home Provider: [facility] Medicine Unit    I discussed the limitations, risks, security and privacy concerns of performing an evaluation and management service by telephone and the availability of in person appointments. I also discussed with the patient that there may be a patient responsible charge related to this service. The patient expressed understanding and agreed to proceed.   History of Present Illness:  Sharon Richardson reports that she has been having an intermittent headache for the last few days, states that yesterday it became worse.  Reports that she has had slight nausea but is eating and drinking okay.  Reports that she has been using Tylenol with moderate relief.  Denies photophobia, any URI symptoms, denies hx of migraines   Reports that she drinks approximately 2 bottles of water a day, states that sleep is good, is sleeping 6 to 7 hours.  Does endorse increased stressors.  No recent eye exam, does wear glasses for reading   Due to language barrier, an interpreter was present during the history-taking and subsequent discussion (and for part of the physical exam) with this patient.      Observations/Objective: Medical history and current medications reviewed, no physical exam completed    History reviewed. No pertinent past medical history.  Past Surgical History:  Procedure Laterality Date  . CESAREAN SECTION    . TUBAL LIGATION      Family History  Problem Relation Age of Onset  . Diabetes Mother   . Hypertension  Father     Social History   Socioeconomic History  . Marital status: Married    Spouse name: Not on file  . Number of children: Not on file  . Years of education: Not on file  . Highest education level: 9th grade  Occupational History  . Not on file  Tobacco Use  . Smoking status: Never Smoker  . Smokeless tobacco: Never Used  Vaping Use  . Vaping Use: Never used  Substance and Sexual Activity  . Alcohol use: No  . Drug use: No  . Sexual activity: Yes    Birth control/protection: Surgical  Other Topics Concern  . Not on file  Social History Narrative  . Not on file   Social Determinants of Health   Financial Resource Strain: Not on file  Food Insecurity: Not on file  Transportation Needs: Not on file  Physical Activity: Not on file  Stress: Not on file  Social Connections: Not on file  Intimate Partner Violence: Not on file    Outpatient Medications Prior to Visit  Medication Sig Dispense Refill  . amLODipine (NORVASC) 5 MG tablet Take 1 tablet (5 mg total) by mouth daily. 90 tablet 1  . diclofenac Sodium (VOLTAREN) 1 % GEL Apply 2 g topically 4 (four) times daily. 100 g 0   No facility-administered medications prior to visit.    No Known Allergies  ROS Review of Systems  Constitutional: Negative for chills, fatigue and fever.  HENT: Negative.   Eyes: Negative.   Respiratory: Negative for cough.   Cardiovascular: Negative for chest  pain and palpitations.  Gastrointestinal: Negative for abdominal pain, nausea and vomiting.  Endocrine: Negative.   Genitourinary: Negative.   Musculoskeletal: Negative.   Skin: Negative.   Allergic/Immunologic: Negative.   Neurological: Positive for headaches. Negative for dizziness, syncope and weakness.  Hematological: Negative.   Psychiatric/Behavioral: Negative.       Objective:     There were no vitals taken for this visit. Wt Readings from Last 3 Encounters:  02/03/20 189 lb 3.2 oz (85.8 kg)  07/22/19 169 lb  9.6 oz (76.9 kg)  01/30/19 176 lb (79.8 kg)     Health Maintenance Due  Topic Date Due  . Hepatitis C Screening  Never done  . COVID-19 Vaccine (1) Never done  . COLONOSCOPY (Pts 45-31yrs Insurance coverage will need to be confirmed)  Never done    There are no preventive care reminders to display for this patient.  Lab Results  Component Value Date   TSH 3.172 10/01/2013   Lab Results  Component Value Date   WBC 7.1 02/03/2020   HGB 13.2 02/03/2020   HCT 40.0 02/03/2020   MCV 85 02/03/2020   PLT 213 02/03/2020   Lab Results  Component Value Date   NA 140 02/03/2020   K 4.2 02/03/2020   CO2 25 02/03/2020   GLUCOSE 96 02/03/2020   BUN 16 02/03/2020   CREATININE 0.57 02/03/2020   BILITOT <0.2 02/03/2020   ALKPHOS 90 02/03/2020   AST 14 02/03/2020   ALT 20 02/03/2020   PROT 7.4 02/03/2020   ALBUMIN 4.4 02/03/2020   CALCIUM 9.5 02/03/2020   ANIONGAP 9 02/16/2015   Lab Results  Component Value Date   CHOL 230 (H) 02/03/2020   Lab Results  Component Value Date   HDL 54 02/03/2020   Lab Results  Component Value Date   LDLCALC 144 (H) 02/03/2020   Lab Results  Component Value Date   TRIG 181 (H) 02/03/2020   Lab Results  Component Value Date   CHOLHDL 4.3 02/03/2020   Lab Results  Component Value Date   HGBA1C 5.8 (H) 02/03/2020      Assessment & Plan:   Problem List Items Addressed This Visit   None   Visit Diagnoses    Acute non intractable tension-type headache    -  Primary   Relevant Orders   CBC with Differential/Platelet   TSH   Vitamin D deficiency       Relevant Orders   Vitamin D, 25-hydroxy     Assessment and Plan: 1. Acute non intractable tension-type headache Patient education given, increase hydration, work on reducing stress, patient [facility].  Continue Tylenol as needed. - CBC with Differential/Platelet; Future - TSH; Future  2. Vitamin D deficiency  - Vitamin D,  25-hydroxy; Future  No AVS created, patient declines my chart Follow Up Instructions:    I discussed the assessment and treatment plan with the patient. The patient was provided an opportunity to ask questions and all were answered. The patient agreed with the plan and demonstrated an understanding of the instructions.   The patient was advised to call back or seek an in-person evaluation if the symptoms worsen or if the condition fails to improve as anticipated.  I provided 12 minutes of non-face-to-face time during this encounter.   No orders of the defined types were placed in this encounter.   Follow-up: Return if symptoms worsen or fail to improve.    Seaborn Nakama S  Tramaine Snell, PA-C 

## 2020-03-31 ENCOUNTER — Other Ambulatory Visit: Payer: Self-pay

## 2020-03-31 ENCOUNTER — Ambulatory Visit: Payer: No Typology Code available for payment source | Attending: Internal Medicine

## 2020-03-31 DIAGNOSIS — G44209 Tension-type headache, unspecified, not intractable: Secondary | ICD-10-CM

## 2020-03-31 DIAGNOSIS — E559 Vitamin D deficiency, unspecified: Secondary | ICD-10-CM

## 2020-04-01 ENCOUNTER — Other Ambulatory Visit: Payer: Self-pay | Admitting: Physician Assistant

## 2020-04-01 ENCOUNTER — Telehealth: Payer: Self-pay | Admitting: *Deleted

## 2020-04-01 LAB — CBC WITH DIFFERENTIAL/PLATELET
Basophils Absolute: 0.1 10*3/uL (ref 0.0–0.2)
Basos: 1 %
EOS (ABSOLUTE): 0.2 10*3/uL (ref 0.0–0.4)
Eos: 2 %
Hematocrit: 38.1 % (ref 34.0–46.6)
Hemoglobin: 12.4 g/dL (ref 11.1–15.9)
Immature Grans (Abs): 0 10*3/uL (ref 0.0–0.1)
Immature Granulocytes: 0 %
Lymphocytes Absolute: 2.4 10*3/uL (ref 0.7–3.1)
Lymphs: 37 %
MCH: 27.6 pg (ref 26.6–33.0)
MCHC: 32.5 g/dL (ref 31.5–35.7)
MCV: 85 fL (ref 79–97)
Monocytes Absolute: 0.5 10*3/uL (ref 0.1–0.9)
Monocytes: 7 %
Neutrophils Absolute: 3.4 10*3/uL (ref 1.4–7.0)
Neutrophils: 53 %
Platelets: 221 10*3/uL (ref 150–450)
RBC: 4.49 x10E6/uL (ref 3.77–5.28)
RDW: 13.1 % (ref 11.7–15.4)
WBC: 6.5 10*3/uL (ref 3.4–10.8)

## 2020-04-01 LAB — VITAMIN D 25 HYDROXY (VIT D DEFICIENCY, FRACTURES): Vit D, 25-Hydroxy: 13.5 ng/mL — ABNORMAL LOW (ref 30.0–100.0)

## 2020-04-01 LAB — TSH: TSH: 2.24 u[IU]/mL (ref 0.450–4.500)

## 2020-04-01 MED ORDER — VITAMIN D (ERGOCALCIFEROL) 1.25 MG (50000 UNIT) PO CAPS: 50000.0000 [IU] | ORAL_CAPSULE | ORAL | 2 refills | Status: DC

## 2020-04-01 MED FILL — VIT D2 1.25 MG (50,000 UNIT: 1.25 MG | 28 days supply | Qty: 4 | Fill #0

## 2020-04-01 NOTE — Addendum Note (Signed)
Addended by: Roney Jaffe on: 04/01/2020 08:29 AM   Modules accepted: Orders

## 2020-04-01 NOTE — Telephone Encounter (Signed)
Medical Assistant used [employer] to contact patient.  Interpreter Name: Trixie Deis #: [id number] Patient verified DOB Patient is aware of labs being normal except for vitamin d level being low and needing to take weekly supplements to address the concern. Patient hopefully will receive relief in her HA's as her level regulates back to normal. Prescription is at the [facility] pharmacy and a recheck will be completed in 3 months.

## 2020-04-01 NOTE — Telephone Encounter (Signed)
-----   Message from Roney Jaffe, [place] sent at 04/01/2020  8:28 AM EST ----- Please call patient and let her know that her thyroid was within normal limits and she does not show signs of anemia.  Her vitamin D level is very low, she needs to take 50,000 units once a week for the next 12 weeks.  I am sending the medication to her pharmacy.  Hopefully this should help improve her headaches

## 2020-04-16 MED FILL — AMLODIPINE BESYLATE 5 MG TA: 5 | 30 days supply | Qty: 30 | Fill #2

## 2020-05-20 MED FILL — AMLODIPINE BESYLATE 5 MG TA: 5 | 30 days supply | Qty: 30 | Fill #3

## 2020-06-02 ENCOUNTER — Telehealth: Payer: No Typology Code available for payment source | Admitting: Internal Medicine

## 2020-06-22 ENCOUNTER — Other Ambulatory Visit: Payer: Self-pay

## 2020-06-22 MED FILL — Amlodipine Besylate Tab 5 MG (Base Equivalent): ORAL | 30 days supply | Qty: 30 | Fill #0 | Status: AC

## 2020-06-23 ENCOUNTER — Other Ambulatory Visit: Payer: Self-pay

## 2020-07-28 MED FILL — Amlodipine Besylate Tab 5 MG (Base Equivalent): ORAL | 30 days supply | Qty: 30 | Fill #1 | Status: AC

## 2020-07-29 ENCOUNTER — Other Ambulatory Visit: Payer: Self-pay

## 2020-08-11 ENCOUNTER — Other Ambulatory Visit: Payer: Self-pay

## 2020-08-11 ENCOUNTER — Ambulatory Visit: Payer: Self-pay | Admitting: Physician Assistant

## 2020-08-11 VITALS — BP 137/74 | HR 61 | Temp 98.2°F | Resp 18 | Ht 60.0 in | Wt 182.0 lb

## 2020-08-11 DIAGNOSIS — Z13220 Encounter for screening for lipoid disorders: Secondary | ICD-10-CM

## 2020-08-11 DIAGNOSIS — Z1159 Encounter for screening for other viral diseases: Secondary | ICD-10-CM

## 2020-08-11 DIAGNOSIS — I10 Essential (primary) hypertension: Secondary | ICD-10-CM

## 2020-08-11 DIAGNOSIS — R7303 Prediabetes: Secondary | ICD-10-CM

## 2020-08-11 DIAGNOSIS — E559 Vitamin D deficiency, unspecified: Secondary | ICD-10-CM

## 2020-08-11 MED ORDER — AMLODIPINE BESYLATE 5 MG PO TABS
ORAL_TABLET | Freq: Every day | ORAL | 1 refills | Status: DC
  Filled 2020-08-11: qty 90, fill #0
  Filled 2020-09-07: qty 30, 30d supply, fill #0
  Filled 2020-10-14: qty 30, 30d supply, fill #1
  Filled 2020-11-19: qty 30, 30d supply, fill #2
  Filled 2020-12-28: qty 30, 30d supply, fill #3
  Filled 2021-02-02: qty 30, 30d supply, fill #4
  Filled 2021-03-12: qty 30, 30d supply, fill #5

## 2020-08-11 NOTE — Progress Notes (Signed)
Established Patient Office Visit  Subjective:  Patient ID: Sharon Richardson, female    DOB: [birth year]  Age: 50 y.o. MRN: [id number]  CC:  Chief Complaint  Patient presents with  . Annual Exam    HPI  Tomeko Esquivel-Gutierrez requests medication refills today.  States that she is doing well overall.  States that she does no check her BP at home.  States that she did complete the proper regimen of vitamin D deficiency treatment.  No other concerns at this time.  Due to language barrier, an interpreter was present during the history-taking and subsequent discussion (and for part of the physical exam) with this patient.   History reviewed. No pertinent past medical history.  Past Surgical History:  Procedure Laterality Date  . CESAREAN SECTION    . TUBAL LIGATION      Family History  Problem Relation Age of Onset  . Diabetes Mother   . Hypertension Father     Social History   Socioeconomic History  . Marital status: Married    Spouse name: Not on file  . Number of children: Not on file  . Years of education: Not on file  . Highest education level: 9th grade  Occupational History  . Not on file  Tobacco Use  . Smoking status: Never Smoker  . Smokeless tobacco: Never Used  Vaping Use  . Vaping Use: Never used  Substance and Sexual Activity  . Alcohol use: No  . Drug use: No  . Sexual activity: Yes    Birth control/protection: Surgical  Other Topics Concern  . Not on file  Social History Narrative  . Not on file   Social Determinants of Health   Financial Resource Strain: Not on file  Food Insecurity: Not on file  Transportation Needs: Not on file  Physical Activity: Not on file  Stress: Not on file  Social Connections: Not on file  Intimate Partner Violence: Not on file    Outpatient Medications Prior to Visit  Medication Sig Dispense Refill  . amLODipine (NORVASC) 5 MG tablet TAKE 1 TABLET (5 MG TOTAL) BY MOUTH DAILY. 90 tablet 1   . diclofenac Sodium (VOLTAREN) 1 % GEL APPLY 2 G TOPICALLY 4 (FOUR) TIMES DAILY. (Patient not taking: Reported on 08/11/2020) 100 g 0  . Vitamin D, Ergocalciferol, (DRISDOL) 1.25 MG (50000 UNIT) CAPS capsule TAKE 1 CAPSULE (50,000 UNITS TOTAL) BY MOUTH EVERY 7 (SEVEN) DAYS. (Patient not taking: Reported on 08/11/2020) 4 capsule 2   No facility-administered medications prior to visit.    No Known Allergies  ROS Review of Systems  Constitutional: Negative for chills and fever.  HENT: Negative.   Eyes: Negative.   Respiratory: Negative for shortness of breath.   Cardiovascular: Negative for chest pain.  Gastrointestinal: Negative.   Endocrine: Negative.   Genitourinary: Negative.   Musculoskeletal: Negative.   Skin: Negative.   Allergic/Immunologic: Negative.   Neurological: Negative.   Hematological: Negative.   Psychiatric/Behavioral: Negative.       Objective:    Physical Exam Vitals and nursing note reviewed.  Constitutional:      Appearance: Normal appearance.  HENT:     Head: Normocephalic and atraumatic.     Right Ear: External ear normal.     Left Ear: External ear normal.     Nose: Nose normal.     Mouth/Throat:     Mouth: Mucous membranes are moist.     Pharynx: Oropharynx is clear.  Eyes:     Extraocular Movements:  Extraocular movements intact.     Conjunctiva/sclera: Conjunctivae normal.     Pupils: Pupils are equal, round, and reactive to light.  Cardiovascular:     Rate and Rhythm: Normal rate and regular rhythm.     Pulses: Normal pulses.     Heart sounds: Normal heart sounds.  Pulmonary:     Effort: Pulmonary effort is normal.     Breath sounds: Normal breath sounds.  Musculoskeletal:        General: Normal range of motion.     Cervical back: Normal range of motion and neck supple.  Skin:    General: Skin is warm and dry.  Neurological:     General: No focal deficit present.     Mental Status: She is alert and oriented to person, place, and  time.  Psychiatric:        Mood and Affect: Mood normal.        Behavior: Behavior normal.        Thought Content: Thought content normal.        Judgment: Judgment normal.     BP 137/74 (BP Location: Left Arm, Patient Position: Sitting, Cuff Size: Normal)   Pulse 61   Temp 98.2 F (36.8 C) (Oral)   Resp 18   Ht 5' (1.524 m)   Wt 182 lb (82.6 kg)   LMP 08/12/2019   SpO2 100%   BMI 35.54 kg/m  Wt Readings from Last 3 Encounters:  08/11/20 182 lb (82.6 kg)  02/03/20 189 lb 3.2 oz (85.8 kg)  07/22/19 169 lb 9.6 oz (76.9 kg)     Health Maintenance Due  Topic Date Due  . COVID-19 Vaccine (1) Never done  . Hepatitis C Screening  Never done  . COLONOSCOPY (Pts 45-76yrs Insurance coverage will need to be confirmed)  Never done    There are no preventive care reminders to display for this patient.  Lab Results  Component Value Date   TSH 2.240 03/31/2020   Lab Results  Component Value Date   WBC 6.5 03/31/2020   HGB 12.4 03/31/2020   HCT 38.1 03/31/2020   MCV 85 03/31/2020   PLT 221 03/31/2020   Lab Results  Component Value Date   NA 140 02/03/2020   K 4.2 02/03/2020   CO2 25 02/03/2020   GLUCOSE 96 02/03/2020   BUN 16 02/03/2020   CREATININE 0.57 02/03/2020   BILITOT <0.2 02/03/2020   ALKPHOS 90 02/03/2020   AST 14 02/03/2020   ALT 20 02/03/2020   PROT 7.4 02/03/2020   ALBUMIN 4.4 02/03/2020   CALCIUM 9.5 02/03/2020   ANIONGAP 9 02/16/2015   Lab Results  Component Value Date   CHOL 230 (H) 02/03/2020   Lab Results  Component Value Date   HDL 54 02/03/2020   Lab Results  Component Value Date   LDLCALC 144 (H) 02/03/2020   Lab Results  Component Value Date   TRIG 181 (H) 02/03/2020   Lab Results  Component Value Date   CHOLHDL 4.3 02/03/2020   Lab Results  Component Value Date   HGBA1C 5.8 (H) 02/03/2020      Assessment & Plan:   Problem List Items Addressed This Visit      Cardiovascular and Mediastinum   Essential hypertension -  Primary   Relevant Medications   amLODipine (NORVASC) 5 MG tablet   Other Relevant Orders   Comp. Metabolic Panel (12)     Other   Prediabetes   Vitamin D deficiency  Relevant Orders   Vitamin D, 25-hydroxy    Other Visit Diagnoses    Encounter for HCV screening test for low risk patient       Relevant Orders   HCV Ab w Reflex to Quant PCR   Screening for lipid disorders       Relevant Orders   Lipid panel    1. Essential hypertension Continue current regimen, patient encouraged to check blood pressure at home a few times a week, keep a written log and have available for all office visits. - amLODipine (NORVASC) 5 MG tablet; TAKE 1 TABLET (5 MG TOTAL) BY MOUTH DAILY.  Dispense: 90 tablet; Refill: 1 - Comp. Metabolic Panel (12)  2. Prediabetes A1c 5.7.  Patient education given on prediabetes eating plan  3. Vitamin D deficiency  - Vitamin D, 25-hydroxy  4. Encounter for HCV screening test for low risk patient  - HCV Ab w Reflex to Quant PCR  5. Screening for lipid disorders  - Lipid panel   I have reviewed the patient's medical history (PMH, PSH, Social History, Family History, Medications, and allergies) , and have been updated if relevant. I spent 32 minutes reviewing chart and  face to face time with patient.     Meds ordered this encounter  Medications  . amLODipine (NORVASC) 5 MG tablet    Sig: TAKE 1 TABLET (5 MG TOTAL) BY MOUTH DAILY.    Dispense:  90 tablet    Refill:  1    Order Specific Question:   Supervising Provider    Answer:   Storm Frisk [[id number]]    Follow-up: Return if symptoms worsen or fail to improve.    Kasandra Knudsen Mayers, PA-C

## 2020-08-11 NOTE — Patient Instructions (Signed)
Your A1c is 5.7.  I recommend a low sugar diet.  We will call you with your lab results.  Please let us know if there is anything else we can do for you  Roney Jaffe, PA-C Physician Assistant [facility] [email or web address]    Plan de alimentacin para personas con prediabetes Prediabetes Eating Plan La prediabetes es una afeccin que hace que los niveles de azcar en la sangre (glucosa) sean ms altos de lo normal. Esto aumenta el riesgo de desarrollar diabetes tipo 2 (diabetes mellitus tipo2). Trabajar con un profesional de la salud o especialista en nutricin (nutricionista) para Warehouse manager en la dieta y el estilo de vida puede ayudar a prevenir el inicio de la diabetes. Estos cambios pueden ayudarlo a:  [place]ohm [place] de glucemia.  Mejorar los niveles de [place].  Controlar la presin arterial. Consejos para seguir [employer] plan Al leer las etiquetas de los alimentos  Lea las etiquetas de los alimentos envasados para controlar la cantidad de grasa, sal (sodio) y azcar que contienen. Evite los alimentos que contengan lo siguiente: ? Grasas saturadas. ? Grasas trans. ? Azcares agregados.  Evite los alimentos que contengan ms de 342miligramos(mg) de sodio por porcin. Limite el consumo de sodio a menos de 2300mg  por . Al ir de compras  Evite comprar alimentos procesados y preelaborados.  Evite comprar bebidas con azcar agregada. Al cocinar  Cocine con aceite de oliva. No use mantequilla, manteca de cerdo ni mantequilla clarificada.  Cocine los alimentos al horno, a la parrilla, asados, al vapor o hervidos. Evite frerlos. Planificacin de las comidas  Trabaje con el nutricionista para crear un plan de alimentacin que sea adecuado para usted. Esto puede incluir el seguimiento de cuntas caloras ingiere al da. Use un registro de alimentos, un cuaderno o una aplicacin mvil para anotar lo que  comi en cada comida.  Considere la posibilidad de seguir Futures trader. Esta puede comprender lo siguiente: ? Comer varias porciones de frutas y verduras frescas por Clinical cytogeneticist. ? Pescado al Futures trader veces por semana. ? Comer una porcin de cereales integrales, frijoles, frutos secos y semillas por da. ? Aceite de [employer] de otras grasas. ? Limitar el consumo de alcohol. ? Limitar la carne roja. ? Usar productos lcteos descremados o con bajo contenido de [place].  Considerar seguir Videm pri Ptuju dieta a base de vegetales. Esta incluye hacer elecciones alimentarias que se concentren en comer principalmente verduras y frutas, cereales, frijoles, frutos secos y semillas.  Si tiene hipertensin arterial, quizs Neomia Dear consumo de sodio o seguir una dieta como el plan de alimentacin basado en los Enfoques Alimentarios para Detener la Hipertensin (Dietary Approaches to Stop Hypertension, DASH). La dieta DASH tiene como objetivo bajar la hipertensin arterial.   Secretary/administrator de vida  Establezca metas para bajar de peso con la ayuda de su equipo de atencin mdica. A la Doran Clay con prediabetes se les recomienda bajar un 7% de su peso corporal.  [employer] al menos 30 minutos de ejercicio, 5 o ms das a Maricela Curet.  Asista a un grupo de apoyo o solicite el apoyo de un consejero de salud mental.  Use los medicamentos de venta libre y los recetados solamente como se lo haya indicado el mdico. Qu alimentos se recomiendan? [place]. Bananas. Manzanas. Naranjas. Uvas. Papaya. Mango. Gran[place]. Kiwi. Pomelo. Cerezas. Nils Pyle Hoover Brunette. Espinaca. Guisantes. Remolachas. Coliflor. Repollo. Brcoli. Zanahorias. Tomates. Cal[place]aza. Deatra James. Hierbas. Pimientos. Cebollas. Pepinos. Coles de Bruselas. Granos  Productos integrales, como panes, galletas, cereales y pastas de salvado o integrales. Avena sin azcar. Trigo burgol. Cebada. Quinua. Arroz integral. Tacos o tortillas de harina de  maz o de salvado. Carnes y Warehouse manager. Carne de ave sin piel. Cortes magros de cerdo y carne de res. Tofu. Huevos. Frutos secos. Frijoles. Lcteos Productos lcteos descremados o semidescremados, como yogur, queso cottage y [place]. [employer] T. Caf. Gaseosas sin azcar o dietticas. Soda. Leche descremada o con bajo contenido de [place]. Productos alternativos a la [place], como [place] de soja o de [place]. Grasas y aceites Aceite de [place]. Aceite de canola. Aceite de girasol. Aceite de semillas de uva. [place]. Nueces. Dulces y postres Pudin sin azcar o con bajo contenido de [place]. Helado y otros postres congelados sin azcar o con bajo contenido de [place]. Alios y condimentos Hierbas. Especias sin sodio. Mostaza. Salsa de pepinillos. [place] con bajo contenido de sal y de International aid/development worker. Salsa barbacoa con bajo contenido de sal y de azcar. Mayonesa con bajo contenido de grasa o sin grasa. Es posible que los productos mencionados arriba no formen una lista completa de las bebidas o los alimentos recomendados. Consulte a un nutricionista para obtener ms informacin. Qu alimentos no se recomiendan? Nils Pyle Frutas enlatadas al almbar. Verduras Verduras enlatadas. Verduras congeladas con mantequilla o salsa de crema. Granos Productos elaborados con [place] y [place], como panes, pastas, bocadillos y cereales. Carnes y [address] de carne con alto contenido de Holiday representative. Carne de ave con piel. Carne empanizada o frita. Carnes procesadas. Lcteos Yogur, queso o [employer]. Bebidas Bebidas azucaradas, como t helado y [place]. Grasas y [employer]. Manteca de cerdo. Mantequilla clarificada. Dulces y [employer], como pasteles, pastelitos, galletas dulces y tarta de [place]. Alios y condimentos Mezclas de especias con sal agregada. [place]. Salsa barbacoa. Mayonesa. Es posible que los productos que se enumeran ms arriba no  sean una lista completa de los alimentos y las bebidas que no se recomiendan. Consulte a un nutricionista para obtener ms informacin. Dnde buscar ms informacin  American Diabetes Association (Asociacin Estadounidense de la Diabetes): www.diabetes.org Resumen  Es posible que deba hacer cambios en la dieta y el estilo de vida para ayudar a prevenir el inicio de la diabetes. Estos cambios pueden ayudarlo a Psychologist, clinical, mejorar los niveles de colesterol y Scientist, physiological presin arterial.  Establezca metas para bajar de peso con la ayuda de su equipo de atencin mdica. A la [employer] con prediabetes se les recomienda bajar un 7% de su peso corporal.  Considere la posibilidad de seguir Clinical cytogeneticist. Esto incluye comer muchas frutas y verduras frescas, cereales integrales, frijoles, frutos secos, semillas, pescado y productos lcteos con bajo contenido de [place], y usar aceite de Location manager de otras grasas. Esta informacin no tiene Theme park manager el consejo del mdico. Asegrese de hacerle al mdico cualquier pregunta que tenga. Document Revised: 09/03/2019 Document Reviewed: 09/03/2019 Elsevier Patient Education  2021 [employer].

## 2020-08-11 NOTE — Progress Notes (Signed)
Patient presents for OV since last visit was 02/2020. Patient has only been taking BP medication which she did not take today. Patient has not eaten today. Patient denies pain at this time.

## 2020-08-12 ENCOUNTER — Other Ambulatory Visit: Payer: Self-pay

## 2020-08-12 LAB — VITAMIN D 25 HYDROXY (VIT D DEFICIENCY, FRACTURES): Vit D, 25-Hydroxy: 20.4 ng/mL — ABNORMAL LOW (ref 30.0–100.0)

## 2020-08-12 LAB — COMP. METABOLIC PANEL (12)
AST: 17 IU/L (ref 0–40)
Albumin/Globulin Ratio: 1.5 (ref 1.2–2.2)
Albumin: 4.4 g/dL (ref 3.8–4.8)
Alkaline Phosphatase: 96 IU/L (ref 44–121)
BUN/Creatinine Ratio: 16 (ref 9–23)
BUN: 10 mg/dL (ref 6–24)
Bilirubin Total: 0.3 mg/dL (ref 0.0–1.2)
Calcium: 9.5 mg/dL (ref 8.7–10.2)
Chloride: 104 mmol/L (ref 96–106)
Creatinine, Ser: 0.63 mg/dL (ref 0.57–1.00)
Globulin, Total: 3 g/dL (ref 1.5–4.5)
Glucose: 106 mg/dL — ABNORMAL HIGH (ref 65–99)
Potassium: 4.4 mmol/L (ref 3.5–5.2)
Sodium: 141 mmol/L (ref 134–144)
Total Protein: 7.4 g/dL (ref 6.0–8.5)
eGFR: 109 mL/min/{1.73_m2} (ref >59)

## 2020-08-12 LAB — HCV AB W REFLEX TO QUANT PCR: HCV Ab: 0.2 s/co ratio (ref 0.0–0.9)

## 2020-08-12 LAB — LIPID PANEL
Chol/HDL Ratio: 4.4 ratio (ref 0.0–4.4)
Cholesterol, Total: 206 mg/dL — ABNORMAL HIGH (ref 100–199)
HDL: 47 mg/dL (ref >39)
LDL Chol Calc (NIH): 140 mg/dL — ABNORMAL HIGH (ref 0–99)
Triglycerides: 107 mg/dL (ref 0–149)
VLDL Cholesterol Cal: 19 mg/dL (ref 5–40)

## 2020-08-12 LAB — HCV INTERPRETATION

## 2020-08-12 MED ORDER — VITAMIN D (ERGOCALCIFEROL) 1.25 MG (50000 UNIT) PO CAPS
ORAL_CAPSULE | ORAL | 2 refills | Status: AC
  Filled 2020-08-12: qty 12, 84d supply, fill #0

## 2020-08-12 NOTE — Addendum Note (Signed)
Addended by: Roney Jaffe on: 08/12/2020 03:00 PM   Modules accepted: Orders

## 2020-08-13 ENCOUNTER — Telehealth: Payer: Self-pay | Admitting: *Deleted

## 2020-08-13 NOTE — Telephone Encounter (Signed)
-----   Message from Roney Jaffe, [place] sent at 08/12/2020  2:59 PM EDT ----- Please call patient and let her know that her kidney function and liver function are within normal limits, her screening for hepatitis C was negative. Her cholesterol is elevated overall and her LDL cholesterol is also elevated.  Her risk of a cardiovascular event in the next 10 years is low, however I do strongly encourage her to follow a low-cholesterol diet and continue to have this monitored once a year.  Her vitamin D continues to remain low, I do recommend that she complete another 12-week regimen of vitamin D supplementation.  Prescription sent to pharmacy.  The 10-year ASCVD risk score Denman George DC Montez Hageman., et al., 2013) is: 2.2%   Values used to calculate the score:     Age: 50 years     Sex: Female     Is Non-Hispanic African American: No     Diabetic: No     Tobacco smoker: No     Systolic Blood Pressure: 137 mmHg     Is BP treated: Yes     HDL Cholesterol: 47 mg/dL     Total Cholesterol: 206 mg/dL

## 2020-08-13 NOTE — Telephone Encounter (Signed)
Interpreter Name: Marylynn Pearson Interpreter #: [id number] Medical Assistant used [employer] to contact patient.  Patient verified DOB Patient is aware of kidney and liver being normal with negative hep c. Patient is aware of low cholesterol diet needing to be followed along with restarting weekly vitamin d for the next 3 months. No further questions.

## 2020-08-14 ENCOUNTER — Other Ambulatory Visit: Payer: Self-pay

## 2020-09-08 ENCOUNTER — Other Ambulatory Visit: Payer: Self-pay

## 2020-09-09 ENCOUNTER — Ambulatory Visit: Payer: Self-pay | Attending: Internal Medicine

## 2020-09-09 ENCOUNTER — Other Ambulatory Visit: Payer: Self-pay

## 2020-10-14 ENCOUNTER — Other Ambulatory Visit: Payer: Self-pay

## 2020-10-15 ENCOUNTER — Other Ambulatory Visit: Payer: Self-pay

## 2020-11-19 ENCOUNTER — Other Ambulatory Visit: Payer: Self-pay

## 2020-11-20 ENCOUNTER — Other Ambulatory Visit: Payer: Self-pay

## 2020-12-28 ENCOUNTER — Other Ambulatory Visit: Payer: Self-pay

## 2020-12-30 ENCOUNTER — Other Ambulatory Visit: Payer: Self-pay

## 2021-02-02 ENCOUNTER — Other Ambulatory Visit: Payer: Self-pay

## 2021-03-12 ENCOUNTER — Other Ambulatory Visit: Payer: Self-pay

## 2021-03-16 ENCOUNTER — Other Ambulatory Visit: Payer: Self-pay

## 2021-04-14 ENCOUNTER — Other Ambulatory Visit: Payer: Self-pay | Admitting: Physician Assistant

## 2021-04-14 DIAGNOSIS — I10 Essential (primary) hypertension: Secondary | ICD-10-CM

## 2021-04-16 ENCOUNTER — Telehealth: Payer: Self-pay | Admitting: Internal Medicine

## 2021-04-16 ENCOUNTER — Other Ambulatory Visit: Payer: Self-pay

## 2021-04-16 DIAGNOSIS — I10 Essential (primary) hypertension: Secondary | ICD-10-CM

## 2021-04-16 NOTE — Telephone Encounter (Signed)
Requested medication (s) are due for refill today: yes  Requested medication (s) are on the active medication list: yes  Last refill:  08/11/20-08/11/21 #90 1 refill  Future visit scheduled: no  Notes to clinic:  last ordered by Carrolyn Meiers, PA. Do you want to refill Rx?     Requested Prescriptions  Pending Prescriptions Disp Refills   amLODipine (NORVASC) 5 MG tablet 90 tablet 1    Sig: TAKE 1 TABLET (5 MG TOTAL) BY MOUTH DAILY.     There is no refill protocol information for this order

## 2021-04-20 ENCOUNTER — Other Ambulatory Visit: Payer: Self-pay

## 2021-04-21 ENCOUNTER — Other Ambulatory Visit: Payer: Self-pay

## 2021-04-21 ENCOUNTER — Telehealth: Payer: Self-pay | Admitting: Internal Medicine

## 2021-04-21 NOTE — Telephone Encounter (Signed)
Requested medication (s) are due for refill today: yes  Requested medication (s) are on the active medication list: yes    Last refill: 08/11/20  #90  1 refill  Future visit scheduled yes 05/19/21  Notes to clinic:Historical Provider, please review. Thank you.  Requested Prescriptions  Pending Prescriptions Disp Refills   amLODipine (NORVASC) 5 MG tablet 90 tablet 1    Sig: TAKE 1 TABLET (5 MG TOTAL) BY MOUTH DAILY.     There is no refill protocol information for this order    Refused Prescriptions Disp Refills   amLODipine (NORVASC) 5 MG tablet 90 tablet 1    Sig: TAKE 1 TABLET (5 MG TOTAL) BY MOUTH DAILY.     There is no refill protocol information for this order

## 2021-04-21 NOTE — Addendum Note (Signed)
Addended by: Matilde Sprang on: 04/21/2021 03:43 PM   Modules accepted: Orders

## 2021-04-21 NOTE — Telephone Encounter (Signed)
Pt has scheduled an appointment to see PCP on 06/15/2021.  Pt asking if possible to receive a short supply of her medication while she waits for her appointment.   [facility] at [facility]  [address] [place] [address]  Phone: [phone] Fax: [phone]  Hours: M-F 7:30a-6:00p

## 2021-04-28 MED ORDER — AMLODIPINE BESYLATE 5 MG PO TABS
ORAL_TABLET | Freq: Every day | ORAL | 1 refills | Status: DC
  Filled 2021-04-28: qty 30, 30d supply, fill #0
  Filled 2021-06-09: qty 30, 30d supply, fill #1

## 2021-04-28 NOTE — Telephone Encounter (Signed)
Rx sent 

## 2021-04-28 NOTE — Addendum Note (Signed)
Addended by: Yehuda Savannah L on: 04/28/2021 05:36 PM   Modules accepted: Orders

## 2021-04-29 ENCOUNTER — Other Ambulatory Visit: Payer: Self-pay

## 2021-05-19 ENCOUNTER — Ambulatory Visit: Payer: No Typology Code available for payment source

## 2021-06-02 ENCOUNTER — Telehealth: Payer: Self-pay | Admitting: Internal Medicine

## 2021-06-02 NOTE — Telephone Encounter (Signed)
Copied from CRM [id number]. Topic: General - Other ?>> Jun 02, 2021  1:39 PM Jaquita Rector A wrote: ?Reason for CRM: Patient called in stated that she received a call that her appointment for 05/07/21 have been changed to 05/13/21 but nothing is noted in the system can someone please contact patient to clarify can be reached at Ph# [phone] ?

## 2021-06-03 NOTE — Telephone Encounter (Signed)
I already spoke with the Pt 

## 2021-06-04 ENCOUNTER — Ambulatory Visit: Payer: No Typology Code available for payment source | Attending: Family Medicine

## 2021-06-04 ENCOUNTER — Other Ambulatory Visit: Payer: Self-pay

## 2021-06-10 ENCOUNTER — Other Ambulatory Visit: Payer: Self-pay

## 2021-06-11 ENCOUNTER — Other Ambulatory Visit: Payer: Self-pay

## 2021-06-15 ENCOUNTER — Encounter: Payer: Self-pay | Admitting: Internal Medicine

## 2021-06-15 ENCOUNTER — Other Ambulatory Visit: Payer: Self-pay

## 2021-06-15 ENCOUNTER — Ambulatory Visit: Payer: Self-pay | Attending: Internal Medicine | Admitting: Internal Medicine

## 2021-06-15 VITALS — BP 126/84 | HR 75 | Resp 16 | Wt 180.8 lb

## 2021-06-15 DIAGNOSIS — Z1231 Encounter for screening mammogram for malignant neoplasm of breast: Secondary | ICD-10-CM

## 2021-06-15 DIAGNOSIS — E782 Mixed hyperlipidemia: Secondary | ICD-10-CM

## 2021-06-15 DIAGNOSIS — I10 Essential (primary) hypertension: Secondary | ICD-10-CM

## 2021-06-15 DIAGNOSIS — Z23 Encounter for immunization: Secondary | ICD-10-CM

## 2021-06-15 DIAGNOSIS — E669 Obesity, unspecified: Secondary | ICD-10-CM

## 2021-06-15 DIAGNOSIS — Z1211 Encounter for screening for malignant neoplasm of colon: Secondary | ICD-10-CM

## 2021-06-15 MED ORDER — AMLODIPINE BESYLATE 5 MG PO TABS
5.0000 mg | ORAL_TABLET | Freq: Every day | ORAL | 2 refills | Status: DC
  Filled 2021-06-15 – 2021-07-11 (×2): qty 90, 90d supply, fill #0
  Filled 2021-10-19: qty 90, 90d supply, fill #1
  Filled 2022-01-27: qty 90, 90d supply, fill #2

## 2021-06-15 NOTE — Progress Notes (Signed)
? ? ?Patient ID: Sharon CooperMaribel Richardson, female    DOB: Aug 12, 1970  MRN: [id number] ? ?CC: Hypertension ? ? ?Subjective: ?Sharon Richardson is a 51 y.o. female who presents for chronic ds management.  Pt speaks english and declines interpreter at this visit. ?Her concerns today include:  ?Pt with hx of obesity, preDM, HTN, Vit D def ? ?HTN:  taking and tolerating Norvasc.  Needing refill but took medication already for today. ?No device at home to check BP ?Tries to limit salt in foods ?No CP/SOB/LE edema ? ?Obesity/PreDM:  she was walking for exercise 2-3 days a wk but stopped when weather got cold.  Just started walking again this past weekend.  Plans to walk 2-3 days a wk ?-Reports she eats a little of everything but smaller portions.  Drinks water and regular Coke. ?She has hyperlipidemia with LDL on last blood test being 140 and total cholesterol of 206. ? ?HM: Due for flu shot, mammogram and colon cancer screening. ?Patient Active Problem List  ? Diagnosis Date Noted  ? Vitamin D deficiency 03/30/2020  ? Acute non intractable tension-type headache 03/30/2020  ? Prediabetes 02/03/2020  ? Acute pain of right knee 02/03/2020  ? Essential hypertension 02/03/2020  ? Screening breast examination 01/30/2019  ? Elevated blood pressure reading 11/16/2018  ? Obesity (BMI 35.0-39.9 without comorbidity) 11/16/2018  ? ANEMIA, IRON DEFICIENCY 06/10/2009  ? OTHER GENERAL SYMPTOMS 06/04/2009  ? Acute upper respiratory infection 02/05/2009  ? ACUTE SINUSITIS, UNSPECIFIED 03/03/2008  ? CERUMEN IMPACTION, BILATERAL 02/18/2008  ?  ? ?Current Outpatient Medications on File Prior to Visit  ?Medication Sig Dispense Refill  ? Vitamin D, Ergocalciferol, (DRISDOL) 1.25 MG (50000 UNIT) CAPS capsule TAKE 1 CAPSULE (50,000 UNITS TOTAL) BY MOUTH EVERY 7 (SEVEN) DAYS. 4 capsule 2  ? ?No current facility-administered medications on file prior to visit.  ? ? ?No Known Allergies ? ?Social History  ? ?Socioeconomic History  ?  Marital status: Married  ?  Spouse name: Not on file  ? Number of children: Not on file  ? Years of education: Not on file  ? Highest education level: 9th grade  ?Occupational History  ? Not on file  ?Tobacco Use  ? Smoking status: Never  ? Smokeless tobacco: Never  ?Vaping Use  ? Vaping Use: Never used  ?Substance and Sexual Activity  ? Alcohol use: No  ? Drug use: No  ? Sexual activity: Yes  ?  Birth control/protection: Surgical  ?Other Topics Concern  ? Not on file  ?Social History Narrative  ? Not on file  ? ?Social Determinants of Health  ? ?Financial Resource Strain: Not on file  ?Food Insecurity: Not on file  ?Transportation Needs: Not on file  ?Physical Activity: Not on file  ?Stress: Not on file  ?Social Connections: Not on file  ?Intimate Partner Violence: Not on file  ? ? ?Family History  ?Problem Relation Age of Onset  ? Diabetes Mother   ? Hypertension Father   ? ? ?Past Surgical History:  ?Procedure Laterality Date  ? CESAREAN SECTION    ? TUBAL LIGATION    ? ? ?ROS: ?Review of Systems ?Negative except as stated above ? ?PHYSICAL EXAM: ?BP 126/84   Pulse 75   Resp 16   Wt 180 lb 12.8 oz (82 kg)   LMP 08/12/2019   SpO2 99%   BMI 35.31 kg/m?   ?Wt Readings from Last 3 Encounters:  ?06/15/21 180 lb 12.8 oz (82 kg)  ?08/11/20 182 lb (82.6  kg)  ?02/03/20 189 lb 3.2 oz (85.8 kg)  ? ? ?Physical Exam ? ?General appearance - alert, well appearing, and in no distress ?Mental status - normal mood, behavior, speech, dress, motor activity, and thought processes ?Neck - supple, no significant adenopathy ?Chest - clear to auscultation, no wheezes, rales or rhonchi, symmetric air entry ?Heart - normal rate, regular rhythm, normal S1, S2, no murmurs, rubs, clicks or gallops ?Extremities - peripheral pulses normal, no pedal edema, no clubbing or cyanosis ? ? ? ?  Latest Ref Rng & Units 08/11/2020  ?  9:31 AM 02/03/2020  ?  3:50 PM 11/16/2018  ? 11:46 AM  ?CMP  ?Glucose 65 - 99 mg/dL 109   96   90    ?BUN 6 - 24  mg/dL 10   16   11     ?Creatinine 0.57 - 1.00 mg/dL   6.04   5.40    ?Sodium 134 - 144 mmol/L 141   140   138    ?Potassium 3.5 - 5.2 mmol/L 4.4   4.2   4.3    ?Chloride 96 - 106 mmol/L 104   103   103    ?CO2 20 - 29 mmol/L  25   22    ?Calcium 8.7 - 10.2 mg/dL 9.5   9.5   9.1    ?Total Protein 6.0 - 8.5 g/dL 7.4   7.4   6.8    ?Total Bilirubin 0.0 - 1.2 mg/dL 0.3   9.81   0.2    ?Alkaline Phos 44 - 121 IU/L 96   90   81    ?AST 0 - 40 IU/L 17   14   18     ?ALT 0 - 32 IU/L  20   19    ? ?Lipid Panel  ?   ?Component Value Date/Time  ? CHOL 206 (H) 08/11/2020 0931  ? TRIG 107 08/11/2020 0931  ? HDL 47 08/11/2020 0931  ? CHOLHDL 4.4 08/11/2020 0931  ? CHOLHDL 3.1 12/26/2014 0910  ? VLDL 14 12/26/2014 0910  ? LDLCALC 140 (H) 08/11/2020 0931  ? ? ?CBC ?   ?Component Value Date/Time  ? WBC 6.5 03/31/2020 1417  ? WBC 4.9 01/13/2016 1140  ? RBC 4.49 03/31/2020 1417  ? RBC 4.76 01/13/2016 1140  ? HGB 12.4 03/31/2020 1417  ? HCT 38.1 03/31/2020 1417  ? PLT 221 03/31/2020 1417  ? MCV 85 03/31/2020 1417  ? MCH 27.6 03/31/2020 1417  ? MCH 28.6 01/13/2016 1140  ? MCHC 32.5 03/31/2020 1417  ? MCHC 32.2 01/13/2016 1140  ? RDW 13.1 03/31/2020 1417  ? LYMPHSABS 2.4 03/31/2020 1417  ? MONOABS 245 01/13/2016 1140  ? EOSABS 0.2 03/31/2020 1417  ? BASOSABS 0.1 03/31/2020 1417  ? ? ?ASSESSMENT AND PLAN: ?1. Essential hypertension ?Close to goal.  Continue Norvasc 5 mg daily. ?- CBC ?- Comprehensive metabolic panel ?- amLODipine (NORVASC) 5 MG tablet; Take 1 tablet (5 mg total) by mouth daily.  Dispense: 90 tablet; Refill: 2 ? ?2. Need for immunization against influenza ?- Flu Vaccine QUAD 48mo+IM (Fluarix, Fluzone & Alfiuria Quad PF) ? ?3. Screening for colon cancer ?- Fecal occult blood, imunochemical([facility]/Sun[facility]) ? ?4. Encounter for screening mammogram for malignant neoplasm of breast ?- MM Digital Screening; Future ? ?5. Obesity (BMI 35.0-39.9 without comorbidity) ?Patient advised to eliminate sugary drinks from the diet, cut  back on portion sizes especially of white carbohydrates, eat more white lean meat like chicken 5mo and  seafood instead of beef or pork and incorporate fresh fruits and vegetables into the diet daily. ?Encouraged her to get in some form of moderate intensity exercise at least 5 days a week for 30 minutes. ?- Hemoglobin A1c ? ?6. Mixed hyperlipidemia ?See #5 above. ?- Lipid panel ? ? ? ?Patient was given the opportunity to ask questions.  Patient verbalized understanding of the plan and was able to repeat key elements of the plan.  ? ?This documentation was completed using Paediatric nurse.  Any transcriptional errors are unintentional. ? ?Orders Placed This Encounter  ?Procedures  ? Fecal occult blood, imunochemical([facility]/Sun[facility])  ? MM Digital Screening  ? Flu Vaccine QUAD 74mo+IM (Fluarix, Fluzone & Alfiuria Quad PF)  ? CBC  ? Comprehensive metabolic panel  ? Lipid panel  ? Hemoglobin A1c  ? ? ? ?Requested Prescriptions  ? ?Signed Prescriptions Disp Refills  ? amLODipine (NORVASC) 5 MG tablet 90 tablet 2  ?  Sig: Take 1 tablet (5 mg total) by mouth daily.  ? ? ?Return in about 4 months (around 10/15/2021). ? ?Jonah Blue, MD, FACP ?

## 2021-06-16 ENCOUNTER — Telehealth: Payer: Self-pay

## 2021-06-16 LAB — COMPREHENSIVE METABOLIC PANEL
ALT: 21 IU/L (ref 0–32)
AST: 17 IU/L (ref 0–40)
Albumin/Globulin Ratio: 1.7 (ref 1.2–2.2)
Albumin: 4.3 g/dL (ref 3.8–4.8)
Alkaline Phosphatase: 110 IU/L (ref 44–121)
BUN/Creatinine Ratio: 19 (ref 9–23)
BUN: 12 mg/dL (ref 6–24)
Bilirubin Total: <0.2 mg/dL (ref 0.0–1.2)
CO2: 23 mmol/L (ref 20–29)
Calcium: 9.4 mg/dL (ref 8.7–10.2)
Chloride: 103 mmol/L (ref 96–106)
Creatinine, Ser: 0.63 mg/dL (ref 0.57–1.00)
Globulin, Total: 2.6 g/dL (ref 1.5–4.5)
Glucose: 93 mg/dL (ref 70–99)
Potassium: 4.4 mmol/L (ref 3.5–5.2)
Sodium: 140 mmol/L (ref 134–144)
Total Protein: 6.9 g/dL (ref 6.0–8.5)
eGFR: 108 mL/min/{1.73_m2} (ref >59)

## 2021-06-16 LAB — CBC
Hematocrit: 40.8 % (ref 34.0–46.6)
Hemoglobin: 12.9 g/dL (ref 11.1–15.9)
MCH: 26.9 pg (ref 26.6–33.0)
MCHC: 31.6 g/dL (ref 31.5–35.7)
MCV: 85 fL (ref 79–97)
Platelets: 198 10*3/uL (ref 150–450)
RBC: 4.8 x10E6/uL (ref 3.77–5.28)
RDW: 13.6 % (ref 11.7–15.4)
WBC: 7.5 10*3/uL (ref 3.4–10.8)

## 2021-06-16 LAB — LIPID PANEL
Chol/HDL Ratio: 4 ratio (ref 0.0–4.4)
Cholesterol, Total: 220 mg/dL — ABNORMAL HIGH (ref 100–199)
HDL: 55 mg/dL (ref >39)
LDL Chol Calc (NIH): 128 mg/dL — ABNORMAL HIGH (ref 0–99)
Triglycerides: 208 mg/dL — ABNORMAL HIGH (ref 0–149)
VLDL Cholesterol Cal: 37 mg/dL (ref 5–40)

## 2021-06-16 LAB — HEMOGLOBIN A1C
Est. average glucose Bld gHb Est-mCnc: 123 mg/dL
Hgb A1c MFr Bld: 5.9 % — ABNORMAL HIGH (ref 4.8–5.6)

## 2021-06-16 NOTE — Telephone Encounter (Signed)
Contacted pt to go over lab results pt is aware and doesn't have any questions or concerns 

## 2021-07-12 ENCOUNTER — Other Ambulatory Visit: Payer: Self-pay

## 2021-07-13 ENCOUNTER — Other Ambulatory Visit: Payer: Self-pay

## 2021-08-27 IMAGING — MG MM DIGITAL DIAGNOSTIC UNILAT*L* W/ TOMO W/ CAD
4 series · 4 of 12 positions shown · non-contrast
Comparison: Previous exam(s).

CLINICAL DATA: Patient presents today recall from screening for a
possible left breast asymmetry.

EXAM:
DIGITAL DIAGNOSTIC UNILATERAL LEFT MAMMOGRAM WITH CAD AND TOMO

[L ML synth-2D]
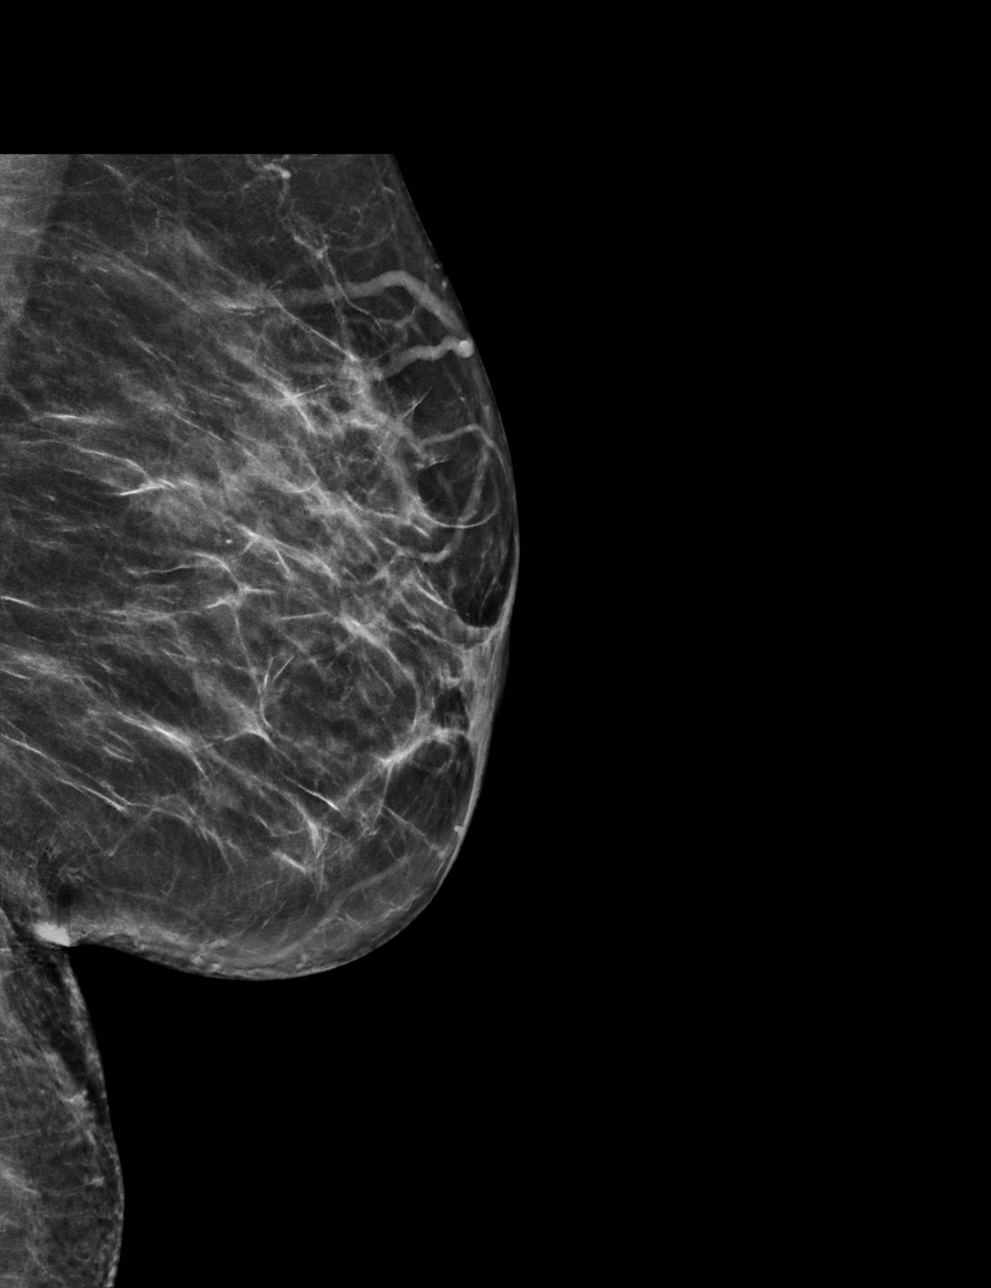

[L MLO synth-2D]
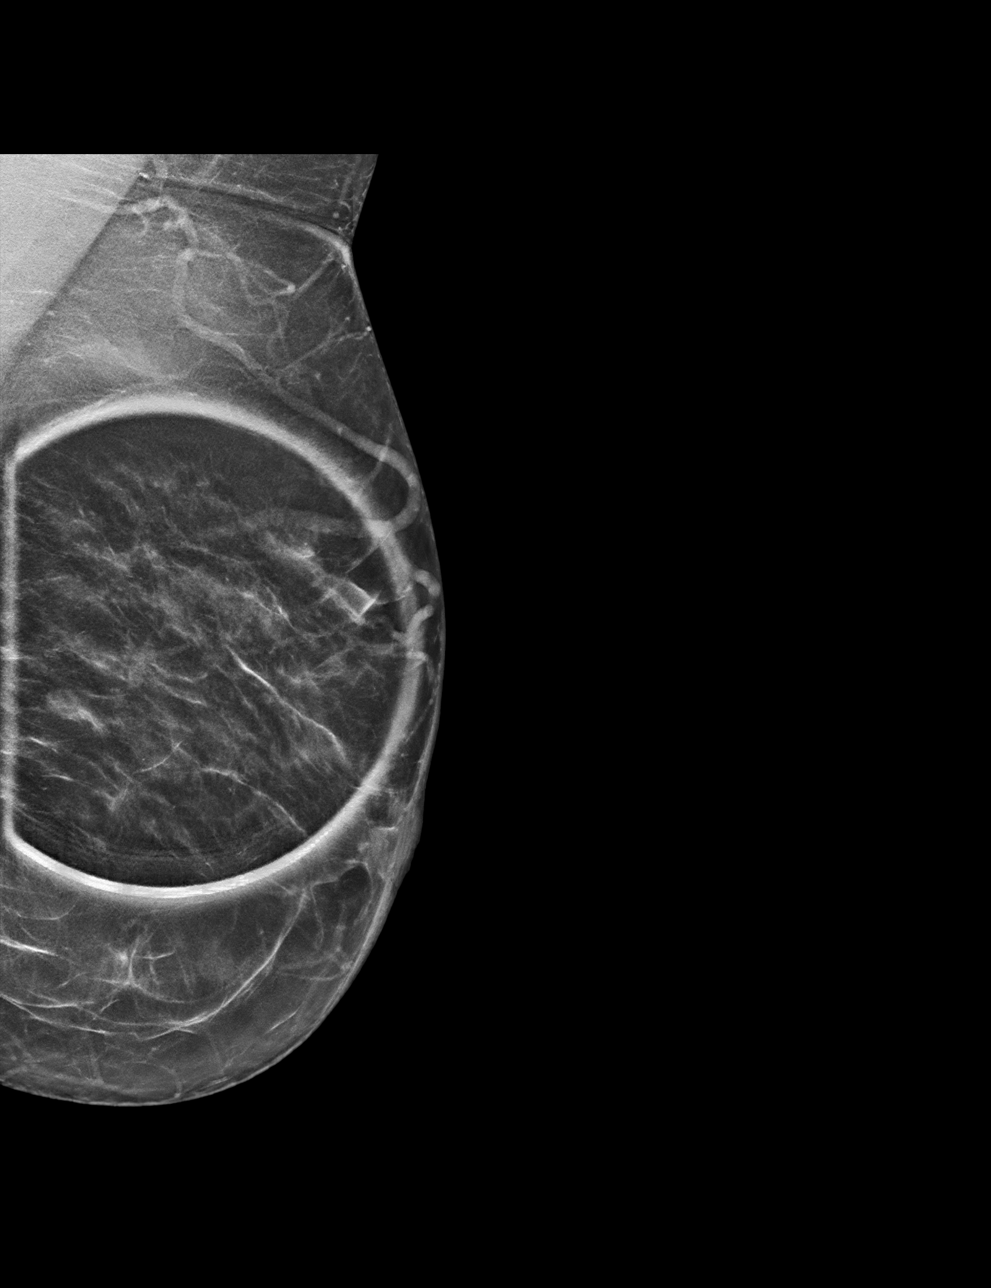

[L MLO tomo · tomo slice 28/55.0]
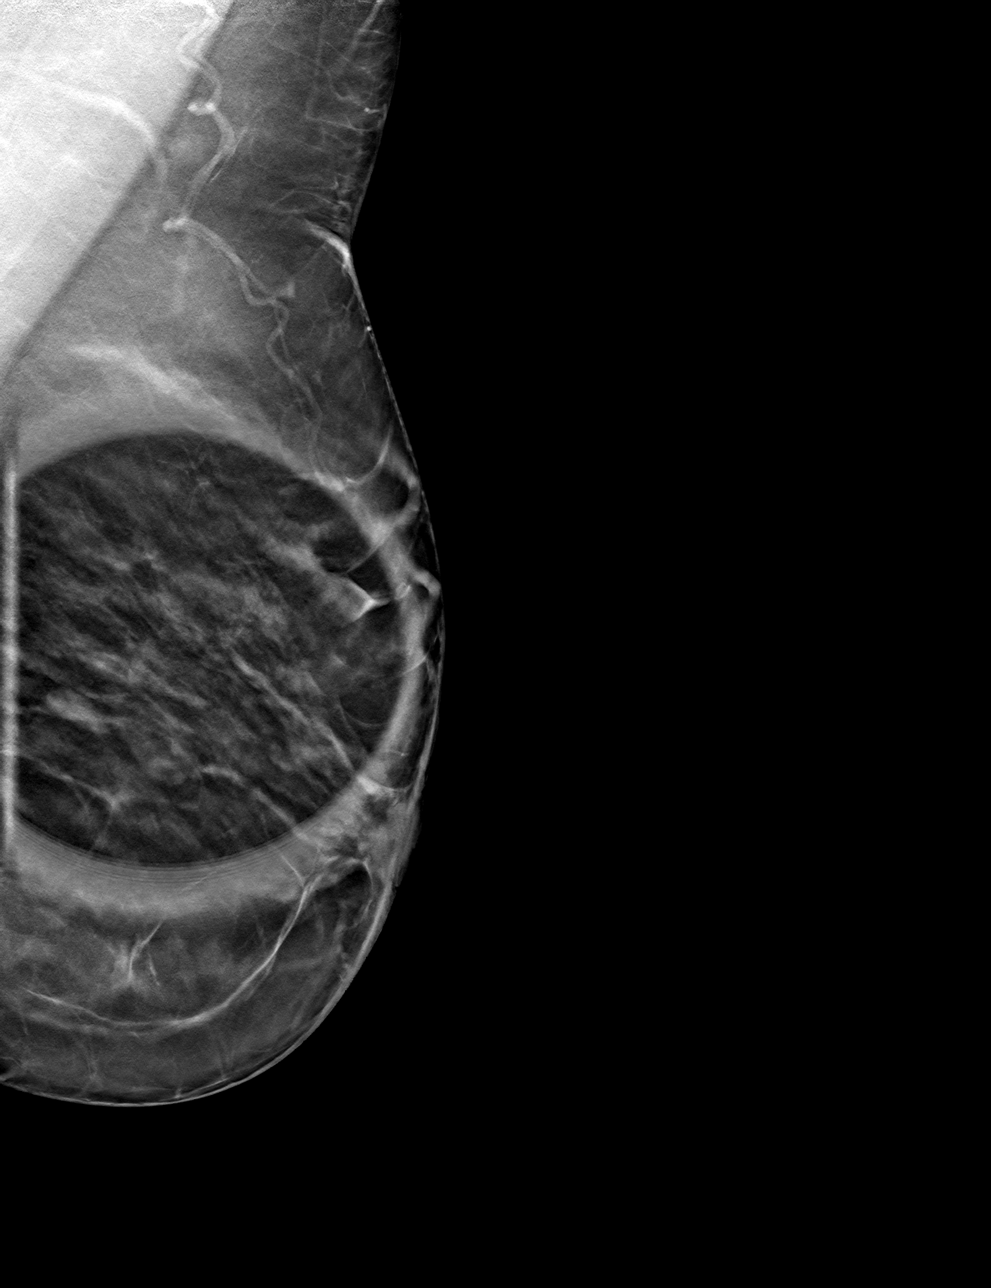

[L ML tomo · tomo slice 33/66.0]
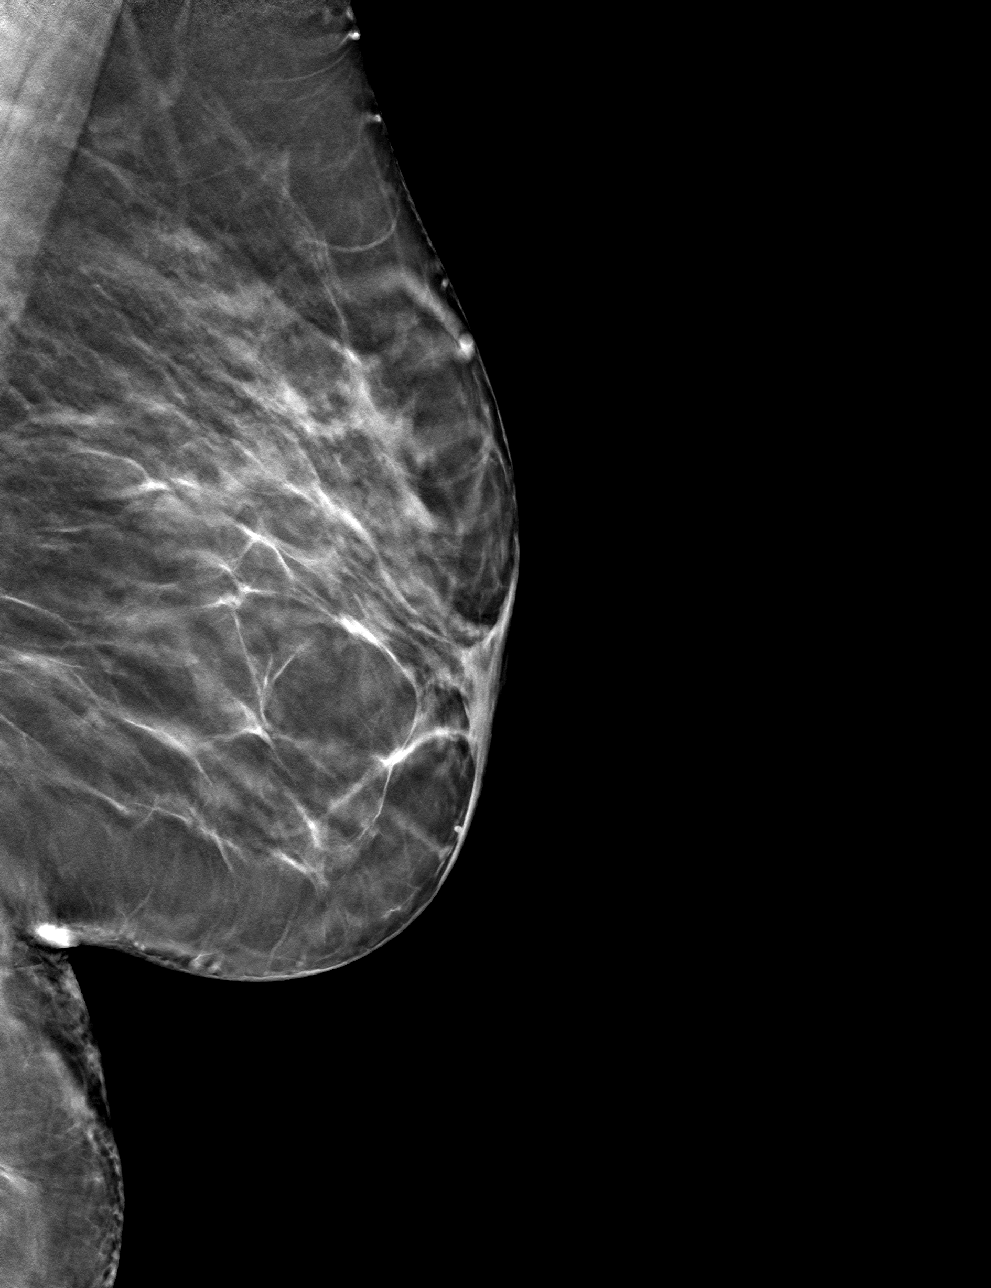

[4 of 12 positions shown; findings below may reference images not displayed]

ACR Breast Density Category c: The breast tissue is heterogeneously
dense, which may obscure small masses.
FINDINGS: Additional spot compression tomosynthesis views were performed for
the questioned asymmetry in the upper left breast. On the additional
imaging the tissue in this area disperses without underlying mass or
distortion. No mammographic evidence of malignancy.

Mammographic images were processed with CAD.
IMPRESSION: No mammographic evidence of malignancy in the left breast.

RECOMMENDATION:
Screening mammogram in one year.(Code:QS-M-634)

I have discussed the findings and recommendations with the patient.
If applicable, a reminder letter will be sent to the patient
regarding the next appointment.

BI-RADS CATEGORY  1: Negative.

## 2021-10-11 ENCOUNTER — Other Ambulatory Visit: Payer: Self-pay | Admitting: Pharmacist

## 2021-10-11 NOTE — Chronic Care Management (AMB) (Signed)
Patient seen by Nils Pyle, PharmD Candidate on 10/11/21 while they were picking up prescriptions at [facility]. Pt denies caffeones intake, nicotine use, or recent exercise this morning. Pt speaks spanish and did not wish to utilize interpreter during this encounter.   Blood pressure today was : 126/87, HR 70   Patient does not have an automated home blood pressure machine.   Medication review was performed. They are taking medications as prescribed. Denies issues with amlodipine.   The following barriers to adherence were noted:  - Denies concerns with medication access or understanding.   The following interventions were completed:  - Medications were reviewed  - Patient was counseled on lifestyle modifications to improve blood pressure including increasing physical activity and reducing sodium in diet.   The patient has follow up scheduled: 10/15/21  PCP: Dr. Olga Millers, PharmD, [facility]  [phone]

## 2021-10-15 ENCOUNTER — Ambulatory Visit: Payer: Self-pay | Attending: Internal Medicine | Admitting: Internal Medicine

## 2021-10-15 VITALS — BP 118/75 | HR 82 | Temp 98.1°F | Ht 60.0 in | Wt 186.0 lb

## 2021-10-15 DIAGNOSIS — G8929 Other chronic pain: Secondary | ICD-10-CM

## 2021-10-15 DIAGNOSIS — Z1211 Encounter for screening for malignant neoplasm of colon: Secondary | ICD-10-CM

## 2021-10-15 DIAGNOSIS — I10 Essential (primary) hypertension: Secondary | ICD-10-CM

## 2021-10-15 DIAGNOSIS — M79672 Pain in left foot: Secondary | ICD-10-CM

## 2021-10-15 DIAGNOSIS — Z23 Encounter for immunization: Secondary | ICD-10-CM

## 2021-10-15 DIAGNOSIS — B353 Tinea pedis: Secondary | ICD-10-CM

## 2021-10-15 NOTE — Progress Notes (Signed)
Patient ID: Sharon Richardson, female    DOB: 1970-08-08  MRN: [id number]  CC: Hypertension   Subjective: Sharon Richardson is a 51 y.o. female who presents for chronic ds management.  Daughter is with her.  Her concerns today include:  Pt with hx of obesity, preDM, HTN, Vit D def  HTN: compliant with Norvasc 5 mg daily and salt restriction. No device to check BP No CP/SOB  C/o pain in left posterior heel x 3 mths.  No initiating factors or injury to that area of the foot. Intermittent pain that throbs and only last a few seconds, occurs a few times a day.  No swelling or redness.  Worse when she has been sitting for a while and stands up Also complains of dry itchy peeling skin along the lateral aspect of this foot.  HM:  due for shingles vaccine.  Did not receive FIT test on last visit Patient Active Problem List   Diagnosis Date Noted   Vitamin D deficiency 03/30/2020   Acute non intractable tension-type headache 03/30/2020   Prediabetes 02/03/2020   Acute pain of right knee 02/03/2020   Essential hypertension 02/03/2020   Screening breast examination 01/30/2019   Elevated blood pressure reading 11/16/2018   Obesity (BMI 35.0-39.9 without comorbidity) 11/16/2018   ANEMIA, IRON DEFICIENCY 06/10/2009   OTHER GENERAL SYMPTOMS 06/04/2009   Acute upper respiratory infection 02/05/2009   ACUTE SINUSITIS, UNSPECIFIED 03/03/2008   CERUMEN IMPACTION, BILATERAL 02/18/2008     Current Outpatient Medications on File Prior to Visit  Medication Sig Dispense Refill   amLODipine (NORVASC) 5 MG tablet Take 1 tablet (5 mg total) by mouth daily. 90 tablet 2   No current facility-administered medications on file prior to visit.    No Known Allergies  Social History   Socioeconomic History   Marital status: Married    Spouse name: Not on file   Number of children: Not on file   Years of education: Not on file   Highest education level: 9th grade  Occupational  History   Not on file  Tobacco Use   Smoking status: Never   Smokeless tobacco: Never  Vaping Use   Vaping Use: Never used  Substance and Sexual Activity   Alcohol use: No   Drug use: No   Sexual activity: Yes    Birth control/protection: Surgical  Other Topics Concern   Not on file  Social History Narrative   Not on file   Social Determinants of Health   Financial Resource Strain: Not on file  Food Insecurity: Not on file  Transportation Needs: No Transportation Needs (01/30/2019)   PRAPARE - Hydrologist (Medical): No    Lack of Transportation (Non-Medical): No  Physical Activity: Not on file  Stress: Not on file  Social Connections: Not on file  Intimate Partner Violence: Not on file    Family History  Problem Relation Age of Onset   Diabetes Mother    Hypertension Father     Past Surgical History:  Procedure Laterality Date   CESAREAN SECTION     TUBAL LIGATION      ROS: Review of Systems Negative except as stated above  PHYSICAL EXAM: BP 118/75   Pulse 82   Temp 98.1 F (36.7 C) (Oral)   Ht 5' (1.524 m)   Wt 186 lb (84.4 kg)   LMP 08/12/2019   SpO2 96%   BMI 36.33 kg/m   Wt Readings from Last 3 Encounters:  10/15/21 186 lb (84.4 kg)  06/15/21 180 lb 12.8 oz (82 kg)  08/11/20 182 lb (82.6 kg)    Physical Exam  General appearance - alert, well appearing, and in no distress Chest - clear to auscultation, no wheezes, rales or rhonchi, symmetric air entry Heart - normal rate, regular rhythm, normal S1, S2, no murmurs, rubs, clicks or gallops Extremities - peripheral pulses normal, no pedal edema, no clubbing or cyanosis Skin -left foot: Thick dry skin on the lateral aspect of the heel to the midfoot.  Some peeling of skin.  No erythema. MSK: Left foot: Mild prominence of the tuberosity of calcaneus bone posteriorly where the Achilles inserts onto the bone.  She has mild tenderness over this bone.  No erythema or edema  appreciated.  No tenderness on palpation over the Achilles tendon itself.  She has spider veins of the ankle.      Latest Ref Rng & Units 06/15/2021    4:28 PM 08/11/2020    9:31 AM 02/03/2020    3:50 PM  CMP  Glucose 70 - 99 mg/dL 93  106  96   BUN 6 - 24 mg/dL _0 Creatinine 0.57 - 1.00 mg/dL 0.63  0.63  0.57   Sodium 134 - 144 mmol/L 140  141  140   Potassium 3.5 - 5.2 mmol/L 4.4  4.4  4.2   Chloride 96 - 106 mmol/L 103  104  103   CO2 20 - 29 mmol/L 23   25   Calcium 8.7 - 10.2 mg/dL 9.4  9.5  9.5   Total Protein 6.0 - 8.5 g/dL 6.9  7.4  7.4   Total Bilirubin 0.0 - 1.2 mg/dL <0.2  0.3  <0.2   Alkaline Phos 44 - 121 IU/L 110  96  90   AST 0 - 40 IU/L _1 ALT 0 - 32 IU/L 21   20    Lipid Panel     Component Value Date/Time   CHOL 220 (H) 06/15/2021 1628   TRIG 208 (H) 06/15/2021 1628   HDL 55 06/15/2021 1628   CHOLHDL 4.0 06/15/2021 1628   CHOLHDL 3.1 12/26/2014 0910   VLDL 14 12/26/2014 0910   LDLCALC 128 (H) 06/15/2021 1628    CBC    Component Value Date/Time   WBC 7.5 06/15/2021 1628   WBC 4.9 01/13/2016 1140   RBC 4.80 06/15/2021 1628   RBC 4.76 01/13/2016 1140   HGB 12.9 06/15/2021 1628   HCT 40.8 06/15/2021 1628   PLT 198 06/15/2021 1628   MCV 85 06/15/2021 1628   MCH 26.9 06/15/2021 1628   MCH 28.6 01/13/2016 1140   MCHC 31.6 06/15/2021 1628   MCHC 32.2 01/13/2016 1140   RDW 13.6 06/15/2021 1628   LYMPHSABS 2.4 03/31/2020 1417   MONOABS 245 01/13/2016 1140   EOSABS 0.2 03/31/2020 1417   BASOSABS 0.1 03/31/2020 1417    ASSESSMENT AND PLAN: 1. Essential hypertension At goal.  Continue Norvasc and low-salt diet.  2. Heel pain, chronic, left Suspect inflammation with the Achilles insert onto the bone.  I recommend ibuprofen over-the-counter as needed.  Advised to wear shoes that are not tight fitting.  Purchase Dr. Felicie Morn shoe  pads over-the-counter and apply to the posterior heel to prevent irritation/friction from her shoes and  rubbing against the bone.  Warm soaks of the foot a few times a week will also be helpful.  3. Tinea pedis  of left foot Recommend over-the-counter Lotrimin cream.  4. Need for shingles vaccine Patient agreeable to receiving first Shingrix vaccine today.  Advised that the vaccine can cause some swelling and redness at the injection site for a few days  5. Screening for colon cancer Patient will pick up FIT kit today - Fecal occult blood, imunochemical([facility])    [employer] Language interpreter used during this encounter. #[id number], Miguel  Patient was given the opportunity to ask questions.  Patient verbalized understanding of the plan and was able to repeat key elements of the plan.   This documentation was completed using Radio producer.  Any transcriptional errors are unintentional.  Orders Placed This Encounter  Procedures   Fecal occult blood, imunochemical([facility])   Varicella-zoster vaccine IM     Requested Prescriptions    No prescriptions requested or ordered in this encounter    Return in about 4 months (around 02/15/2022).  Karle Plumber, MD, FACP

## 2021-10-19 ENCOUNTER — Ambulatory Visit
Admission: RE | Admit: 2021-10-19 | Discharge: 2021-10-19 | Disposition: A | Discharge status: Home / Self Care | Payer: No Typology Code available for payment source | Source: Ambulatory Visit | Attending: Internal Medicine | Admitting: Internal Medicine

## 2021-10-19 ENCOUNTER — Other Ambulatory Visit: Payer: Self-pay

## 2021-10-19 DIAGNOSIS — Z1231 Encounter for screening mammogram for malignant neoplasm of breast: Secondary | ICD-10-CM

## 2021-10-20 LAB — FECAL OCCULT BLOOD, IMMUNOCHEMICAL: Fecal Occult Bld: NEGATIVE

## 2021-10-22 ENCOUNTER — Other Ambulatory Visit: Payer: Self-pay | Admitting: Obstetrics and Gynecology

## 2021-10-22 DIAGNOSIS — R928 Other abnormal and inconclusive findings on diagnostic imaging of breast: Secondary | ICD-10-CM

## 2021-10-28 ENCOUNTER — Ambulatory Visit
Admission: RE | Admit: 2021-10-28 | Discharge: 2021-10-28 | Disposition: A | Discharge status: Home / Self Care | Payer: No Typology Code available for payment source | Source: Ambulatory Visit | Attending: Obstetrics and Gynecology | Admitting: Obstetrics and Gynecology

## 2021-10-28 DIAGNOSIS — R928 Other abnormal and inconclusive findings on diagnostic imaging of breast: Secondary | ICD-10-CM

## 2022-01-07 ENCOUNTER — Ambulatory Visit: Payer: Self-pay | Attending: Internal Medicine | Admitting: Pharmacist

## 2022-01-07 DIAGNOSIS — Z23 Encounter for immunization: Secondary | ICD-10-CM

## 2022-01-07 NOTE — Progress Notes (Signed)
Patient presents for vaccination against zoster per orders of Dr. Johnson. Consent given. Counseling provided. No contraindications exists. Vaccine administered without incident.   Luke Van Ausdall, PharmD, BCACP, CPP Clinical Pharmacist [facility] [phone]  

## 2022-01-27 ENCOUNTER — Other Ambulatory Visit: Payer: Self-pay

## 2022-05-05 ENCOUNTER — Ambulatory Visit: Payer: Self-pay

## 2022-05-19 ENCOUNTER — Other Ambulatory Visit: Payer: Self-pay | Admitting: Pharmacist

## 2022-05-19 ENCOUNTER — Other Ambulatory Visit: Payer: Self-pay

## 2022-05-19 ENCOUNTER — Ambulatory Visit: Payer: Self-pay | Attending: Internal Medicine

## 2022-05-19 DIAGNOSIS — I10 Essential (primary) hypertension: Secondary | ICD-10-CM

## 2022-05-19 MED ORDER — AMLODIPINE BESYLATE 5 MG PO TABS
5.0000 mg | ORAL_TABLET | Freq: Every day | ORAL | 0 refills | Status: DC
  Filled 2022-05-19: qty 30, 30d supply, fill #0

## 2022-05-20 ENCOUNTER — Other Ambulatory Visit: Payer: Self-pay

## 2022-06-13 ENCOUNTER — Ambulatory Visit: Payer: Self-pay | Admitting: Internal Medicine

## 2022-06-14 ENCOUNTER — Ambulatory Visit: Payer: Self-pay | Attending: Internal Medicine | Admitting: Internal Medicine

## 2022-06-14 ENCOUNTER — Other Ambulatory Visit: Payer: Self-pay

## 2022-06-14 ENCOUNTER — Encounter: Payer: Self-pay | Admitting: Internal Medicine

## 2022-06-14 VITALS — BP 112/74 | HR 85 | Temp 98.2°F | Ht 60.0 in | Wt 193.0 lb

## 2022-06-14 DIAGNOSIS — Z23 Encounter for immunization: Secondary | ICD-10-CM

## 2022-06-14 DIAGNOSIS — Z6837 Body mass index (BMI) 37.0-37.9, adult: Secondary | ICD-10-CM

## 2022-06-14 DIAGNOSIS — R7303 Prediabetes: Secondary | ICD-10-CM

## 2022-06-14 DIAGNOSIS — I10 Essential (primary) hypertension: Secondary | ICD-10-CM

## 2022-06-14 LAB — GLUCOSE, POCT (MANUAL RESULT ENTRY): POC Glucose: 121 mg/dl — AB (ref 70–99)

## 2022-06-14 LAB — POCT GLYCOSYLATED HEMOGLOBIN (HGB A1C): HbA1c, POC (prediabetic range): 6.1 % (ref 5.7–6.4)

## 2022-06-14 MED ORDER — AMLODIPINE BESYLATE 5 MG PO TABS
5.0000 mg | ORAL_TABLET | Freq: Every day | ORAL | 1 refills | Status: DC
  Filled 2022-06-14: qty 90, 90d supply, fill #0
  Filled 2022-10-12: qty 90, 90d supply, fill #1

## 2022-06-14 NOTE — Progress Notes (Signed)
Patient ID: Sharon Richardson, female    DOB: [birth year]  MRN: [id number]  CC: Hypertension (HTN f/u. Med refill. Velta [facility] to flu vax. Pt requesting physical. )   Subjective: Sharon Richardson is a 52 y.o. female who presents for chronic ds management Her concerns today include:  Pt with hx of obesity, preDM, HTN, Vit D def   AM[employer] Language interpreter used during this encounter. [place], [id number]  HTN:  on Norvasc 5 mg daily.  Admits that she forgets to take it about 2x/wk.  Needs RF but still has some left.  Normally takes it in the mornings.  Tries to limit salt in the foods Wgh up 7 lbs since last visit 09/2021.  Attributes to her love of tortias and bread. Eats 4-5 torias twice a day.  Drinks sodas.  Use to exercise but has not done so in a while Results for orders placed or performed in visit on 06/14/22  POCT glucose (manual entry)  Result Value Ref Range   POC Glucose 121 (A) 70 - 99 mg/dl  POCT glycosylated hemoglobin (Hb A1C)  Result Value Ref Range   Hemoglobin A1C     HbA1c POC (<> result, manual entry)     HbA1c, POC (prediabetic range) 6.1 5.7 - 6.4 %   HbA1c, POC (controlled diabetic range)      Patient Active Problem List   Diagnosis Date Noted   Vitamin D deficiency 03/30/2020   Acute non intractable tension-type headache 03/30/2020   Prediabetes 02/03/2020   Acute pain of right knee 02/03/2020   Essential hypertension 02/03/2020   Screening breast examination 01/30/2019   Elevated blood pressure reading 11/16/2018   Obesity (BMI 35.0-39.9 without comorbidity) 11/16/2018   ANEMIA, IRON DEFICIENCY 06/10/2009   OTHER GENERAL SYMPTOMS 06/04/2009   Acute upper respiratory infection 02/05/2009   ACUTE SINUSITIS, UNSPECIFIED 03/03/2008   CERUMEN IMPACTION, BILATERAL 02/18/2008     No current outpatient medications on file prior to visit.   No current facility-administered medications on file prior to visit.    No Known Allergies  Social History    Socioeconomic History   Marital status: Married    Spouse name: Not on file   Number of children: Not on file   Years of education: Not on file   Highest education level: 9th grade  Occupational History   Not on file  Tobacco Use   Smoking status: Never   Smokeless tobacco: Never  Vaping Use   Vaping Use: Never used  Substance and Sexual Activity   Alcohol use: No   Drug use: No   Sexual activity: Yes    Birth control/protection: Surgical  Other Topics Concern   Not on file  Social History Narrative   Not on file   Social Determinants of Health   Financial Resource Strain: Not on file  Food Insecurity: Not on file  Transportation Needs: No Transportation Needs (01/30/2019)   PRAPARE - Hydrologist (Medical): No    Lack of Transportation (Non-Medical): No  Physical Activity: Not on file  Stress: Not on file  Social Connections: Not on file  Intimate Partner Violence: Not on file    Family History  Problem Relation Age of Onset   Diabetes Mother    Hypertension Father    Breast cancer Neg Hx     Past Surgical History:  Procedure Laterality Date   CESAREAN SECTION     TUBAL LIGATION      ROS: Review of Systems  Negative except as stated above  PHYSICAL EXAM: BP 112/74 (BP Location: Left Arm, Patient Position: Sitting, Cuff Size: Normal)   Pulse 85   Temp 98.2 F (36.8 C) (Oral)   Ht 5' (1.524 m)   Wt 193 lb (87.5 kg)   LMP 08/12/2019   SpO2 98%   BMI 37.69 kg/m   Wt Readings from Last 3 Encounters:  06/14/22 193 lb (87.5 kg)  10/15/21 186 lb (84.4 kg)  06/15/21 180 lb 12.8 oz (82 kg)    Physical Exam  General appearance - alert, well appearing, and in no distress Mental status - normal mood, behavior, speech, dress, motor activity, and thought processes Chest - clear to auscultation, no wheezes, rales or rhonchi, symmetric air entry Heart - normal rate, regular rhythm, normal S1, S2, no murmurs, rubs, clicks or  gallops Extremities - peripheral pulses normal, no pedal edema, no clubbing or cyanosis      Latest Ref Rng & Units 06/15/2021    4:28 PM 08/11/2020    9:31 AM 02/03/2020    3:50 PM  CMP  Glucose 70 - 99 mg/dL 93  106  96   BUN 6 - 24 mg/dL 12  10  16    Creatinine 0.57 - 1.00 mg/dL 0.63  0.63  0.57   Sodium 134 - 144 mmol/L 140  141  140   Potassium 3.5 - 5.2 mmol/L 4.4  4.4  4.2   Chloride 96 - 106 mmol/L 103  104  103   CO2 20 - 29 mmol/L 23   25   Calcium 8.7 - 10.2 mg/dL 9.4  9.5  9.5   Total Protein 6.0 - 8.5 g/dL 6.9  7.4  7.4   Total Bilirubin 0.0 - 1.2 mg/dL <0.2  0.3  <0.2   Alkaline Phos 44 - 121 IU/L 110  96  90   AST 0 - 40 IU/L 17  17  14    ALT 0 - 32 IU/L 21   20    Lipid Panel     Component Value Date/Time   CHOL 220 (H) 06/15/2021 1628   TRIG 208 (H) 06/15/2021 1628   HDL 55 06/15/2021 1628   CHOLHDL 4.0 06/15/2021 1628   CHOLHDL 3.1 12/26/2014 0910   VLDL 14 12/26/2014 0910   LDLCALC 128 (H) 06/15/2021 1628    CBC    Component Value Date/Time   WBC 7.5 06/15/2021 1628   WBC 4.9 01/13/2016 1140   RBC 4.80 06/15/2021 1628   RBC 4.76 01/13/2016 1140   HGB 12.9 06/15/2021 1628   HCT 40.8 06/15/2021 1628   PLT 198 06/15/2021 1628   MCV 85 06/15/2021 1628   MCH 26.9 06/15/2021 1628   MCH 28.6 01/13/2016 1140   MCHC 31.6 06/15/2021 1628   MCHC 32.2 01/13/2016 1140   RDW 13.6 06/15/2021 1628   LYMPHSABS 2.4 03/31/2020 1417   MONOABS 245 01/13/2016 1140   EOSABS 0.2 03/31/2020 1417   BASOSABS 0.1 03/31/2020 1417    ASSESSMENT AND PLAN: 1. Essential hypertension At goal.  Continue amlodipine 5 mg daily. - amLODipine (NORVASC) 5 MG tablet; Take 1 tablet (5 mg total) by mouth daily.  Dispense: 90 tablet; Refill: 1 - CBC  2. Class 2 severe obesity due to excess calories with serious comorbidity and body mass index (BMI) of 37.0 to 37.9 in adult [facility]) Encourage Healthy Eating Habits.  We Have Set a Goal for Her to Cut Back to No More Than 2 Tortillas  per Meal  and to Change from White Bread to Whole-Grain Wheat Bread.  She Will Also Stop Drinking Sodas and Drink More Water. - Comprehensive metabolic panel - Lipid panel  3. Prediabetes See #2 above - POCT glucose (manual entry) - POCT glycosylated hemoglobin (Hb A1C)  4. Need for immunization against influenza - Flu Vaccine QUAD 37mo+IM (Fluarix, Fluzone & Alfiuria Quad PF)   Patient was given the opportunity to ask questions.  Patient verbalized understanding of the plan and was able to repeat key elements of the plan.   This documentation was completed using Radio producer.  Any transcriptional errors are unintentional.  Orders Placed This Encounter  Procedures   Flu Vaccine QUAD 26mo+IM (Fluarix, Fluzone & Alfiuria Quad PF)   CBC   Comprehensive metabolic panel   Lipid panel   POCT glucose (manual entry)   POCT glycosylated hemoglobin (Hb A1C)     Requested Prescriptions   Signed Prescriptions Disp Refills   amLODipine (NORVASC) 5 MG tablet 90 tablet 1    Sig: Take 1 tablet (5 mg total) by mouth daily.    Return in about 2 months (around 08/14/2022) for PAP and physical.  Karle Plumber, MD, Rosalita Chessman

## 2022-06-15 LAB — COMPREHENSIVE METABOLIC PANEL
ALT: 54 IU/L — ABNORMAL HIGH (ref 0–32)
AST: 33 IU/L (ref 0–40)
Albumin/Globulin Ratio: 1.6 (ref 1.2–2.2)
Albumin: 4.3 g/dL (ref 3.8–4.9)
Alkaline Phosphatase: 120 IU/L (ref 44–121)
BUN/Creatinine Ratio: 20 (ref 9–23)
BUN: 15 mg/dL (ref 6–24)
Bilirubin Total: 0.2 mg/dL (ref 0.0–1.2)
CO2: 22 mmol/L (ref 20–29)
Calcium: 9.4 mg/dL (ref 8.7–10.2)
Chloride: 103 mmol/L (ref 96–106)
Creatinine, Ser: 0.76 mg/dL (ref 0.57–1.00)
Globulin, Total: 2.7 g/dL (ref 1.5–4.5)
Glucose: 96 mg/dL (ref 70–99)
Potassium: 4.5 mmol/L (ref 3.5–5.2)
Sodium: 140 mmol/L (ref 134–144)
Total Protein: 7 g/dL (ref 6.0–8.5)
eGFR: 95 mL/min/{1.73_m2} (ref >59)

## 2022-06-15 LAB — CBC
Hematocrit: 39.5 % (ref 34.0–46.6)
Hemoglobin: 12.5 g/dL (ref 11.1–15.9)
MCH: 27.2 pg (ref 26.6–33.0)
MCHC: 31.6 g/dL (ref 31.5–35.7)
MCV: 86 fL (ref 79–97)
Platelets: 200 10*3/uL (ref 150–450)
RBC: 4.59 x10E6/uL (ref 3.77–5.28)
RDW: 14 % (ref 11.7–15.4)
WBC: 7.2 10*3/uL (ref 3.4–10.8)

## 2022-06-15 LAB — LIPID PANEL
Chol/HDL Ratio: 4.1 ratio (ref 0.0–4.4)
Cholesterol, Total: 199 mg/dL (ref 100–199)
HDL: 49 mg/dL (ref >39)
LDL Chol Calc (NIH): 113 mg/dL — ABNORMAL HIGH (ref 0–99)
Triglycerides: 210 mg/dL — ABNORMAL HIGH (ref 0–149)
VLDL Cholesterol Cal: 37 mg/dL (ref 5–40)

## 2022-06-28 ENCOUNTER — Ambulatory Visit (HOSPITAL_COMMUNITY)
Admission: EM | Admit: 2022-06-28 | Discharge: 2022-06-28 | Disposition: A | Discharge status: Home / Self Care | Payer: Self-pay | Attending: Emergency Medicine | Admitting: Emergency Medicine

## 2022-06-28 ENCOUNTER — Encounter (HOSPITAL_COMMUNITY): Payer: Self-pay

## 2022-06-28 DIAGNOSIS — H1131 Conjunctival hemorrhage, right eye: Secondary | ICD-10-CM

## 2022-06-28 MED ORDER — OLOPATADINE HCL 0.1 % OP SOLN: 1.0000 [drp] | Freq: Two times a day (BID) | OPHTHALMIC | 0 refills | Status: DC

## 2022-06-28 NOTE — ED Provider Notes (Signed)
[facility]    CSN: [id number] Arrival date & time: 06/28/22  1822     History   Chief Complaint Chief Complaint  Patient presents with   Eye Problem    HPI Sharon Richardson is a 52 y.o. female.  Here with 8 day history of right eye redness No injury or trauma to the eye. Denies pain or itching. Sometimes just feels dry. Denies vision changes. No drainage/discharge. No fevers Denies recent illness or cough Has tried lubricating drops  Surgery on her left eye when she was 15. Abnormal pupil since then   History reviewed. No pertinent past medical history.  Patient Active Problem List   Diagnosis Date Noted   Vitamin D deficiency 03/30/2020   Acute non intractable tension-type headache 03/30/2020   Prediabetes 02/03/2020   Acute pain of right knee 02/03/2020   Essential hypertension 02/03/2020   Screening breast examination 01/30/2019   Elevated blood pressure reading 11/16/2018   Obesity (BMI 35.0-39.9 without comorbidity) 11/16/2018   ANEMIA, IRON DEFICIENCY 06/10/2009   OTHER GENERAL SYMPTOMS 06/04/2009   Acute upper respiratory infection 02/05/2009   ACUTE SINUSITIS, UNSPECIFIED 03/03/2008   CERUMEN IMPACTION, BILATERAL 02/18/2008    Past Surgical History:  Procedure Laterality Date   CESAREAN SECTION     TUBAL LIGATION      OB History     Gravida  6   Para      Term      Preterm      AB  1   Living  5      SAB  1   IAB      Ectopic      Multiple      Live Births               Home Medications    Prior to Admission medications   Medication Sig Start Date End Date Taking? Authorizing Provider  olopatadine (PATANOL) 0.1 % ophthalmic solution Place 1 drop into both eyes 2 (two) times daily. 06/28/22  Yes Caryn Gienger, Lurena Joiner, PA-C  amLODipine (NORVASC) 5 MG tablet Take 1 tablet (5 mg total) by mouth daily. 06/14/22   Marcine Matar, MD    Family History Family History  Problem Relation Age of Onset   Diabetes  Mother    Hypertension Father    Breast cancer Neg Hx     Social History Social History   Tobacco Use   Smoking status: Never   Smokeless tobacco: Never  Vaping Use   Vaping Use: Never used  Substance Use Topics   Alcohol use: No   Drug use: No     Allergies   Patient has no known allergies.   Review of Systems Review of Systems As per HPI  Physical Exam Triage Vital Signs ED Triage Vitals  Enc Vitals Group     BP 06/28/22 1859 135/82     Pulse Rate 06/28/22 1859 79     Resp 06/28/22 1859 16     Temp 06/28/22 1859 97.8 F (36.6 C)     Temp Source 06/28/22 1859 Oral     SpO2 06/28/22 1859 98 %     Weight --      Height --      Head Circumference --      Peak Flow --      Pain Score 06/28/22 1901 0     Pain Loc --      Pain Edu? --      Excl. in GC? --  No data found.  Updated Vital Signs BP 135/82 (BP Location: Left Arm)   Pulse 79   Temp 97.8 F (36.6 C) (Oral)   Resp 16   LMP 08/12/2019   SpO2 98%   Visual Acuity Right Eye Distance:   Left Eye Distance:   Bilateral Distance:    Right Eye Near:   Left Eye Near:    Bilateral Near:     Physical Exam Vitals and nursing note reviewed.  Constitutional:      General: She is not in acute distress.    Appearance: She is not ill-appearing.  HENT:     Head: Atraumatic.     Mouth/Throat:     Mouth: Mucous membranes are moist.     Pharynx: Oropharynx is clear.  Eyes:     General: Lids are normal. Vision grossly intact. Gaze aligned appropriately. No visual field deficit.       Right eye: No foreign body or discharge.     Extraocular Movements: Extraocular movements intact.     Conjunctiva/sclera:     Right eye: Hemorrhage present. No chemosis.     Comments: Left pupil is abnormal - s/p surgery. Right pupil reactive to light. Subconjunctival hemorrhage medial eye.   Cardiovascular:     Rate and Rhythm: Normal rate and regular rhythm.  Pulmonary:     Effort: Pulmonary effort is normal.   Musculoskeletal:     Cervical back: Normal range of motion.  Neurological:     Mental Status: She is alert and oriented to person, place, and time.     UC Treatments / Results  Labs (all labs ordered are listed, but only abnormal results are displayed) Labs Reviewed - No data to display  EKG   Radiology No results found.  Procedures Procedures (including critical care time)  Medications Ordered in UC Medications - No data to display  Initial Impression / Assessment and Plan / UC Course  I have reviewed the triage vital signs and the nursing notes.  Pertinent labs & imaging results that were available during my care of the patient were reviewed by me and considered in my medical decision making (see chart for details).  Subconjunctival hemorrhage Vision intact, no pain or swelling. Discussed will go away on it's own. Can use allergy eye drops for dry sensation. Discussed signs to watch for that warrant ED visit. Patient agrees to plan  Final Clinical Impressions(s) / UC Diagnoses   Final diagnoses:  Subconjunctival hemorrhage of right eye     Discharge Instructions      i recommend to use the eye drops twice daily. this will help with dry eyes and help to sooth any itching. please read the attached information regarding your eye. this will go away on it's own! it can take 2-3 weeks.  please monitor for change in symptoms. go to the emergency department if you have sudden pain in the eye, swelling, vision changes, or fever.   Recomiendo usar las gotas para los ojos [employer] veces al da. esto ayudar con los ojos secos y ayudar a Associate Professoraliviar la picazn. lea la informacin adjunta sobre su ojo. Esto desaparecer por s solo! puede tardar de 2 a 3 semanas.  por favor controle los cambios en los sntomas. Vaya al departamento de emergencias si tiene dolor repentino en el ojo, hinchazn, cambios en la visin o fiebre.    ED Prescriptions     Medication Sig Dispense Auth.  Provider   olopatadine (PATANOL) 0.1 % ophthalmic solution Place  1 drop into both eyes 2 (two) times daily. 5 mL Jamarion [place], Lurena Joiner, PA-C      PDMP not reviewed this encounter.   Dereke Neumann, Ray Church 06/28/22 2010

## 2022-06-28 NOTE — ED Triage Notes (Signed)
Pt states right eye redness for the past 8 days.  Pts sclera on the right eye is red. Pt denies any visual changes.

## 2022-06-28 NOTE — Discharge Instructions (Addendum)
i recommend to use the eye drops twice daily. this will help with dry eyes and help to sooth any itching. please read the attached information regarding your eye. this will go away on it's own! it can take 2-3 weeks.  please monitor for change in symptoms. go to the emergency department if you have sudden pain in the eye, swelling, vision changes, or fever.   Recomiendo usar las gotas para los ojos [employer] al da. esto ayudar con los ojos secos y ayudar a Associate Professor. lea la informacin adjunta sobre su ojo. Esto desaparecer por s solo! puede tardar de 2 a 3 semanas.  por favor controle los cambios en los sntomas. Vaya al departamento de emergencias si tiene dolor repentino en el ojo, hinchazn, cambios en la visin o fiebre.

## 2022-08-23 ENCOUNTER — Other Ambulatory Visit (HOSPITAL_COMMUNITY)
Admission: RE | Admit: 2022-08-23 | Discharge: 2022-08-23 | Disposition: A | Discharge status: Home / Self Care | Payer: Self-pay | Source: Ambulatory Visit | Attending: Internal Medicine | Admitting: Internal Medicine

## 2022-08-23 ENCOUNTER — Encounter: Payer: Self-pay | Admitting: Internal Medicine

## 2022-08-23 ENCOUNTER — Ambulatory Visit: Payer: Self-pay | Attending: Internal Medicine | Admitting: Internal Medicine

## 2022-08-23 VITALS — BP 117/75 | HR 80 | Temp 98.3°F | Ht 60.0 in | Wt 194.0 lb

## 2022-08-23 DIAGNOSIS — H6123 Impacted cerumen, bilateral: Secondary | ICD-10-CM

## 2022-08-23 DIAGNOSIS — Z0000 Encounter for general adult medical examination without abnormal findings: Secondary | ICD-10-CM

## 2022-08-23 DIAGNOSIS — Z124 Encounter for screening for malignant neoplasm of cervix: Secondary | ICD-10-CM | POA: Insufficient documentation

## 2022-08-23 DIAGNOSIS — N950 Postmenopausal bleeding: Secondary | ICD-10-CM

## 2022-08-23 DIAGNOSIS — Z6837 Body mass index (BMI) 37.0-37.9, adult: Secondary | ICD-10-CM

## 2022-08-23 NOTE — Patient Instructions (Signed)
Alimentacin saludable en los adultos Healthy Eating, Adult Una alimentacin saludable puede ayudarlo a alcanzar y mantener un peso saludable, reducir el riesgo de tener enfermedades crnicas y vivir una vida larga y productiva. Es importante que siga una modalidad de alimentacin saludable. Sus necesidades nutricionales y calricas deben satisfacerse principalmente con distintos alimentos ricos en nutrientes. Consejos para seguir este plan Lea las etiquetas de los alimentos Lea las etiquetas y elija las que digan lo siguiente: Productos reducidos en sodio o con bajo contenido de sodio. Jugos con 100 % jugo de fruta. Alimentos con bajo contenido de grasas saturadas (menos de 3 g por porcin) y alto contenido de grasas poliinsaturadas y monoinsaturadas. Alimentos con cereales integrales, como trigo integral, trigo partido, arroz integral y arroz salvaje. Cereales integrales fortificados con cido flico. Esto se recomienda a las mujeres embarazadas o que desean quedar embarazadas. Lea las etiquetas y no coma ni beba lo siguiente: Alimentos o bebidas con azcar agregada. Estos incluyen los alimentos que contienen azcar moreno, endulzante a base de maz, jarabe de maz, dextrosa, fructosa, glucosa, jarabe de maz de alta fructosa, miel, azcar invertido, lactosa, jarabe de malta, maltosa, melaza, azcar sin refinar, sacarosa, trehalosa y azcar turbinado. Limite el consumo de azcar agregada a menos del 10 % del total de caloras diarias. No consuma ms que las siguientes cantidades de azcar agregada por da: 6 cucharaditas (25 g) para las mujeres. 9 cucharaditas (38 g) para los hombres. Los alimentos que contienen almidones y cereales refinados o procesados. Los productos de cereales refinados, como harina blanca, harina de maz desgerminada, pan blanco y arroz blanco. Al ir de compras Elija refrigerios ricos en nutrientes, como verduras, frutas enteras y frutos secos. Evite los refrigerios con  alto contenido de caloras y azcar, como las papas fritas, los refrigerios frutales y los caramelos. Use alios y productos para untar a base de aceite con los alimentos en lugar de grasas slidas como la mantequilla, la margarina, la crema agria o el queso crema. Limite las salsas, las mezclas y los productos "instantneos" preelaborados como el arroz saborizado, los fideos instantneos y las pastas listas para comer. Pruebe ms fuentes de protena vegetal, como tofu, tempeh, frijoles negros, edamame, lentejas, frutos secos y semillas. Explore planes de alimentacin como la dieta mediterrnea o la dieta vegetariana. Pruebe salsas cardiosaludables hechas con frijoles y grasas saludables, como hummus y guacamole. Las verduras van muy bien con ellas. Al cocinar Use aceite para saltear los alimentos en lugar de grasas slidas como mantequilla, margarina o manteca de cerdo. En lugar de frer, trate de cocinar en el horno, en la plancha o en la parrilla, o hervir los alimentos. Retire la parte grasa de las carnes antes de cocinarlas. Cocine las verduras al vapor en agua o caldo. Planificacin de las comidas  En las comidas, imagine dividir su plato en cuartos: La mitad del plato tiene frutas y verduras. Un cuarto del plato tiene cereales integrales. Un cuarto del plato tiene protena, especialmente carnes magras, aves, huevos, tofu, frijoles o frutos secos. Incluya lcteos descremados en su dieta diaria. Estilo de vida Elija opciones saludables en todos los mbitos, como en el hogar, el trabajo, la escuela, los restaurantes y las tiendas. Prepare los alimentos de un modo seguro: Lvese las manos despus de manipular carnes crudas. Donde prepare alimentos, mantenga las superficies limpias lavndolas regularmente con agua caliente y jabn. Mantenga las carnes crudas separadas de los alimentos que estn listos para comer como las frutas y las verduras. Cocine los frutos   de mar, carnes, aves y huevos  hasta alcanzar la temperatura recomendada. Consiga un termmetro para alimentos. Almacene los alimentos a temperaturas seguras. En general: Mantenga los alimentos fros a una temperatura de 40 F (4,4 C) o inferior. Mantenga los alimentos calientes a una temperatura de 140 F (60 C) o superior. Mantenga el congelador a una temperatura de 0 F (-17,8 C) o inferior. Los alimentos no son seguros para su consumo cuando han estado a una temperatura de entre 40 y 140 F (4.4 y 60 C) por ms de 2 horas. Qu alimentos debo comer? Frutas Propngase comer entre 1 y 2 tazas de frutas frescas, enlatadas (en su jugo natural) o congeladas cada da. Una taza de fruta equivale a 1 manzana pequea, 1 banana grande, 8 fresas grandes, 1 taza (237 g) de fruta enlatada,  taza (82 g) de fruta seca o 1 taza (240 ml) de jugo al 100 %. Verduras Propngase comer de 2 a 4 tazas de verduras frescas y congeladas cada da, incluyendo diferentes variedades y colores. Una taza de verduras equivale a 1 taza (91 g) de brcoli o coliflor, 2 zanahorias medianas, 2 tazas (150 g) de verduras de hoja verde crudas, 1 tomate grande, 1 pimiento morrn grande, 1 batata grande o 1 patata blanca mediana. Cereales Propngase comer el equivalente a entre 4 y 10 onzas de cereales integrales por da. Algunos ejemplos de equivalentes a 1 onza de cereales son 1 rebanada de pan, 1 taza (40 g) de cereal listo para comer, 3 tazas (24 g) de palomitas de maz o  taza (93 g) de arroz cocido. Carnes y otras protenas Propngase comer el equivalente a entre 5 y 7  onzas de protena por da. Algunos ejemplos de equivalentes a 1 onza de protenas incluyen 1 huevo,  oz de frutos secos (12 almendras, 24 pistachos o 7 mitades de nueces), 1/4 taza (90 g) de frijoles cocidos, 6 cucharadas (90 g) de hummus o 1 cucharada (16 g) de mantequilla de man. Un corte de carne o pescado del tamao de un mazo de cartas equivale aproximadamente a 3 a 4 onzas (85  g). De las protenas que consume cada semana, intente que al menos 8 onzas (227 g) sean frutos de mar. Esto equivale a unas 2 porciones por semana. Esto incluye salmn, trucha, arenque y anchoas. Lcteos Propngase comer el equivalente a 3 tazas de lcteos descremados o con bajo contenido de grasa cada da. Algunos ejemplos de equivalentes a 1 taza de lcteos son 1 taza (240 ml) de leche, 8 onzas (250 g) de yogur, 1 onzas (44 g) de queso natural o 1 taza (240 ml) de leche de soja fortificada. Grasas y aceites Propngase consumir alrededor de 5 cucharaditas (21 g) de grasas y aceites por da. Elija grasas monoinsaturadas, como el aceite de canola y de oliva, la mayonesa hecha con aceite de oliva o de aguacate, aguacates, mantequilla de man y la mayora de los frutos secos, o bien grasas poliinsaturadas, como el aceite de girasol, maz y soja, nueces, piones, semillas de ssamo, semillas de girasol y semillas de lino. Bebidas Propngase beber 6 vasos de 8 onzas de agua por da. Limite el caf a entre 3 y 5 tazas de ocho onzas por da. Limite el consumo de bebidas con cafena que tengan caloras agregadas, como los refrescos y las bebidas energizantes. Si bebe alcohol: Limite la cantidad que bebe a lo siguiente: De 0 a 1 medida al da si es mujer. De 0 a 2 medidas   al da si es varn. Sepa cunta cantidad de alcohol hay en las bebidas que toma. En los [place], una medida es una botella de cerveza de 12 oz (355 ml), un vaso de vino de 5 oz (148 ml) o un vaso de una bebida alcohlica de alta graduacin de 1 oz (44 ml). Condimentos y otros alimentos Trate de no agregar demasiada sal a los alimentos. Trate de usar hierbas y especias en lugar de sal. Trate de no agregar azcar a los alimentos. Esta informacin se basa en las pautas de nutricin de los [place] Para obtener ms informacin, visite choosemyplate.gov. Las cantidades exactas pueden variar. Es posible que necesite cantidades  diferentes. Esta informacin no tiene como fin reemplazar el consejo del mdico. Asegrese de hacerle al mdico cualquier pregunta que tenga. Document Revised: 01/04/2022 Document Reviewed: 01/04/2022 Elsevier Patient Education  2024 Elsevier Inc.  

## 2022-08-23 NOTE — Progress Notes (Signed)
Patient ID: Sharon Richardson, female    DOB: 1970-09-14  MRN: [id number]  CC: Gynecologic Exam (Pap & requesting physical )   Subjective: Sharon Richardson is a 52 y.o. female who presents for physical with PAP Her concerns today include:  Pt with hx of obesity, preDM, HTN, Vit D def   AMN Language interpreter used during this encounter. #[id number], [place]  GYN History:  Pt is G5P4 (1 abortion) Any hx of abn paps?: no Menses regular or irregular?:  no menses in 2 yrs but then got a cycle in Feb 2024 that lasted 2 days How long does menses last?  Menstrual flow light or heavy?: NA Method of birth control?:  NA Any vaginal dischg at this time?:  no Dysuria?: no Any hx of STI?: no Sexually active with how many partners:  Desires STI screen:  Last MMG: 10/2021 Family hx of uterine, cervical or breast cancer?:    Patient Active Problem List   Diagnosis Date Noted   Vitamin D deficiency 03/30/2020   Acute non intractable tension-type headache 03/30/2020   Prediabetes 02/03/2020   Acute pain of right knee 02/03/2020   Essential hypertension 02/03/2020   Screening breast examination 01/30/2019   Elevated blood pressure reading 11/16/2018   Obesity (BMI 35.0-39.9 without comorbidity) 11/16/2018   ANEMIA, IRON DEFICIENCY 06/10/2009   OTHER GENERAL SYMPTOMS 06/04/2009   Acute upper respiratory infection 02/05/2009   ACUTE SINUSITIS, UNSPECIFIED 03/03/2008   CERUMEN IMPACTION, BILATERAL 02/18/2008     Current Outpatient Medications on File Prior to Visit  Medication Sig Dispense Refill   amLODipine (NORVASC) 5 MG tablet Take 1 tablet (5 mg total) by mouth daily. 90 tablet 1   olopatadine (PATANOL) 0.1 % ophthalmic solution Place 1 drop into both eyes 2 (two) times daily. (Patient not taking: Reported on 08/23/2022) 5 mL 0   No current facility-administered medications on file prior to visit.    No Known Allergies  Social History   Socioeconomic History    Marital status: Married    Spouse name: Not on file   Number of children: Not on file   Years of education: Not on file   Highest education level: 9th grade  Occupational History   Not on file  Tobacco Use   Smoking status: Never   Smokeless tobacco: Never  Vaping Use   Vaping Use: Never used  Substance and Sexual Activity   Alcohol use: No   Drug use: No   Sexual activity: Yes    Birth control/protection: Surgical  Other Topics Concern   Not on file  Social History Narrative   Not on file   Social Determinants of Health   Financial Resource Strain: Not on file  Food Insecurity: Not on file  Transportation Needs: No Transportation Needs (01/30/2019)   PRAPARE - Administrator, Civil Service (Medical): No    Lack of Transportation (Non-Medical): No  Physical Activity: Not on file  Stress: Not on file  Social Connections: Not on file  Intimate Partner Violence: Not on file    Family History  Problem Relation Age of Onset   Diabetes Mother    Hypertension Father    Breast cancer Neg Hx     Past Surgical History:  Procedure Laterality Date   CESAREAN SECTION     TUBAL LIGATION      ROS: Review of Systems  Constitutional:        Not getting in much exercise.  Reports her eating habits can  be better.  HENT:  Negative for hearing loss and sore throat.   Eyes:  Negative for visual disturbance.  Respiratory:  Negative for cough and shortness of breath.   Cardiovascular:  Negative for chest pain.  Gastrointestinal:  Negative for blood in stool.  Genitourinary:  Negative for difficulty urinating.   Negative except as stated above  PHYSICAL EXAM: BP 117/75 (BP Location: Left Arm, Patient Position: Sitting, Cuff Size: Normal)   Pulse 80   Temp 98.3 F (36.8 C) (Oral)   Ht 5' (1.524 m)   Wt 194 lb (88 kg)   LMP 08/12/2019   SpO2 97%   BMI 37.89 kg/m   Physical Exam  General appearance - alert, well appearing, and in no distress Mental status -  normal mood, behavior, speech, dress, motor activity, and thought processes Eyes -pupil is irregular in the left eye.  Patient states this was from surgery that she had in childhood from an eye injury.  Extraocular movements intact. Ears -mild to moderate amount of impacted cerumen in both ears. Nose - normal and patent, no erythema, discharge or polyps Mouth - mucous membranes moist, pharynx normal without lesions Neck - supple, no significant adenopathy Lymphatics - no palpable lymphadenopathy, no hepatosplenomegaly Chest - clear to auscultation, no wheezes, rales or rhonchi, symmetric air entry Heart - normal rate, regular rhythm, normal S1, S2, no murmurs, rubs, clicks or gallops Abdomen - soft, nontender, nondistended, no masses or organomegaly Breasts - breasts appear normal, no suspicious masses, no skin or nipple changes or axillary nodes Pelvic - normal external genitalia, vulva, vagina, cervix, uterus and adnexa Musculoskeletal - no joint tenderness, deformity or swelling Extremities -no lower extremity edema.  She has spider veins over the lower legs.      Latest Ref Rng & Units 06/14/2022    3:14 PM 06/15/2021    4:28 PM 08/11/2020    9:31 AM  CMP  Glucose 70 - 99 mg/dL 96  93  981   BUN 6 - 24 mg/dL 15  12  10    Creatinine 0.57 - 1.00 mg/dL 1.91  4.78  2.95   Sodium 134 - 144 mmol/L 140  140  141   Potassium 3.5 - 5.2 mmol/L 4.5  4.4  4.4   Chloride 96 - 106 mmol/L 103  103  104   CO2 20 - 29 mmol/L 22  23    Calcium 8.7 - 10.2 mg/dL 9.4  9.4  9.5   Total Protein 6.0 - 8.5 g/dL 7.0  6.9  7.4   Total Bilirubin 0.0 - 1.2 mg/dL 0.2  <6.2  0.3   Alkaline Phos 44 - 121 IU/L 120  110  96   AST 0 - 40 IU/L 33  17  17   ALT 0 - 32 IU/L 54  21     Lipid Panel     Component Value Date/Time   CHOL 199 06/14/2022 1514   TRIG 210 (H) 06/14/2022 1514   HDL 49 06/14/2022 1514   CHOLHDL 4.1 06/14/2022 1514   CHOLHDL 3.1 12/26/2014 0910   VLDL 14 12/26/2014 0910   LDLCALC 113  (H) 06/14/2022 1514    CBC    Component Value Date/Time   WBC 7.2 06/14/2022 1514   WBC 4.9 01/13/2016 1140   RBC 4.59 06/14/2022 1514   RBC 4.76 01/13/2016 1140   HGB 12.5 06/14/2022 1514   HCT 39.5 06/14/2022 1514   PLT 200 06/14/2022 1514   MCV 86 06/14/2022 1514  MCH 27.2 06/14/2022 1514   MCH 28.6 01/13/2016 1140   MCHC 31.6 06/14/2022 1514   MCHC 32.2 01/13/2016 1140   RDW 14.0 06/14/2022 1514   LYMPHSABS 2.4 03/31/2020 1417   MONOABS 245 01/13/2016 1140   EOSABS 0.2 03/31/2020 1417   BASOSABS 0.1 03/31/2020 1417    ASSESSMENT AND PLAN:  1. Annual physical exam Patient advised to eliminate sugary drinks from the diet, cut back on portion sizes especially of white carbohydrates, eat more white lean meat like chicken [place] and seafood instead of beef or pork and incorporate fresh fruits and vegetables into the diet daily. Encouraged her to get in some form of moderate intensity exercise at least 3 to 5 days a week for 30 minutes.  2. Pap smear for cervical cancer screening - Cytology - PAP - Cervicovaginal ancillary only  3. Postmenopausal bleeding - Ambulatory referral to Gynecology - US Pelvis Complete; Future  4. Impacted cerumen of both ears Recommend purchase some wax softener over-the-counter and using in both ears.  5. Class 2 severe obesity due to excess calories with serious comorbidity and body mass index (BMI) of 37.0 to 37.9 in adult [facility]) See #1 above.    Patient was given the opportunity to ask questions.  Patient verbalized understanding of the plan and was able to repeat key elements of the plan.   This documentation was completed using Paediatric nurse.  Any transcriptional errors are unintentional.  No orders of the defined types were placed in this encounter.    Requested Prescriptions    No prescriptions requested or ordered in this encounter    No follow-ups on file.  Jonah Blue, MD, FACP

## 2022-08-24 ENCOUNTER — Other Ambulatory Visit: Payer: Self-pay

## 2022-08-24 ENCOUNTER — Other Ambulatory Visit: Payer: Self-pay | Admitting: Internal Medicine

## 2022-08-24 LAB — CERVICOVAGINAL ANCILLARY ONLY
Bacterial Vaginitis (gardnerella): POSITIVE — AB
Candida Glabrata: NEGATIVE
Candida Vaginitis: NEGATIVE
Chlamydia: NEGATIVE
Comment: NEGATIVE
Comment: NEGATIVE
Comment: NEGATIVE
Comment: NEGATIVE
Comment: NEGATIVE
Comment: NORMAL
Neisseria Gonorrhea: NEGATIVE
Trichomonas: NEGATIVE

## 2022-08-24 MED ORDER — METRONIDAZOLE 500 MG PO TABS
500.0000 mg | ORAL_TABLET | Freq: Two times a day (BID) | ORAL | 0 refills | Status: DC
  Filled 2022-08-24: qty 14, 7d supply, fill #0

## 2022-08-25 ENCOUNTER — Other Ambulatory Visit: Payer: Self-pay

## 2022-08-26 LAB — CYTOLOGY - PAP
Adequacy: ABSENT
Comment: NEGATIVE
Diagnosis: NEGATIVE
High risk HPV: NEGATIVE

## 2022-08-29 ENCOUNTER — Telehealth: Payer: Self-pay | Admitting: Internal Medicine

## 2022-08-29 ENCOUNTER — Ambulatory Visit (HOSPITAL_COMMUNITY): Admission: RE | Admit: 2022-08-29 | Payer: Self-pay | Source: Ambulatory Visit

## 2022-08-29 NOTE — Addendum Note (Signed)
Addended by: Jonah Blue B on: 08/29/2022 01:15 PM   Modules accepted: Orders

## 2022-08-29 NOTE — Telephone Encounter (Signed)
Ritu with Radiology is calling in because they received orders for this pt. Ritu says the orders were for a Transabdominal Ultrasound, but Ritu says they usually do a Transvaginal Ultrasound for postmenopausal bleeding. Ritu says the pt's appointment is at 3:30 and they need a follow up call as soon as possible. Please advise. [phone]

## 2022-08-29 NOTE — Telephone Encounter (Signed)
Order has been changed by PCP to Transvaginal Ultrasound for postmenopausal bleeding . Ritu aware and is cancelling the Transabdominal Ultrasound.

## 2022-09-08 ENCOUNTER — Ambulatory Visit: Payer: Self-pay

## 2022-09-13 ENCOUNTER — Ambulatory Visit: Payer: Self-pay | Attending: Internal Medicine

## 2022-10-12 ENCOUNTER — Other Ambulatory Visit: Payer: Self-pay

## 2022-10-18 ENCOUNTER — Other Ambulatory Visit: Payer: Self-pay

## 2022-12-26 ENCOUNTER — Encounter: Payer: Self-pay | Admitting: Internal Medicine

## 2022-12-26 ENCOUNTER — Other Ambulatory Visit: Payer: Self-pay

## 2022-12-26 ENCOUNTER — Ambulatory Visit: Payer: Self-pay | Attending: Internal Medicine | Admitting: Internal Medicine

## 2022-12-26 VITALS — BP 126/76 | HR 74 | Temp 98.0°F | Ht 60.0 in | Wt 196.0 lb

## 2022-12-26 DIAGNOSIS — Z1211 Encounter for screening for malignant neoplasm of colon: Secondary | ICD-10-CM

## 2022-12-26 DIAGNOSIS — E66812 Obesity, class 2: Secondary | ICD-10-CM

## 2022-12-26 DIAGNOSIS — Z6838 Body mass index (BMI) 38.0-38.9, adult: Secondary | ICD-10-CM

## 2022-12-26 DIAGNOSIS — N950 Postmenopausal bleeding: Secondary | ICD-10-CM

## 2022-12-26 DIAGNOSIS — Z23 Encounter for immunization: Secondary | ICD-10-CM

## 2022-12-26 DIAGNOSIS — Z1231 Encounter for screening mammogram for malignant neoplasm of breast: Secondary | ICD-10-CM

## 2022-12-26 DIAGNOSIS — I10 Essential (primary) hypertension: Secondary | ICD-10-CM

## 2022-12-26 MED ORDER — AMLODIPINE BESYLATE 5 MG PO TABS
5.0000 mg | ORAL_TABLET | Freq: Every day | ORAL | 1 refills | Status: DC
  Filled 2022-12-26: qty 90, 90d supply, fill #0

## 2022-12-26 NOTE — Progress Notes (Signed)
Patient ID: Sharon Richardson, female    DOB: 05-25-1970  MRN: [id number]  CC: Hypertension (HTN f/u. Med refill. /Reports unable to do [place] due to required payment at time of service /Yes to flu vax. Currently working financial assistance before colonoscopy referral)   Subjective: Sharon Richardson is a 52 y.o. female who presents for chronic ds management. Her concerns today include:  Pt with hx of obesity, preDM, HL, HTN, Vit D def   [employer] Language interpreter used during this encounter. # Renae Fickle [phone]  Referred to gyn and pelvic US ordered on last visit for post-menopausal bleeding.  She has not had [place] because of cost.  Reports she was told she had to bring $400 to gyn visit which she does not have at this time.  Has applied for [facility] financial 2 mths ago but has not heard anything as yet.  She has not had any further bleeding since 04/2022 Due for her MMG  HTN:  on Norvasc 5 mg daily. Tries to limit salt in foods  Obesity/PreDM:  reports she is not exercising and "I'm still eating the same."  Not exercising due to work schedule; works 2 jobs and works on Sundays as well.  Eats a lot of rice, tortias, beans.  Eats about 8-10 tortias/day   Yes to flu shot today.  Due for colon CA screening and MMG Patient Active Problem List   Diagnosis Date Noted   Vitamin D deficiency 03/30/2020   Acute non intractable tension-type headache 03/30/2020   Prediabetes 02/03/2020   Acute pain of right knee 02/03/2020   Essential hypertension 02/03/2020   Screening breast examination 01/30/2019   Elevated blood pressure reading 11/16/2018   Obesity (BMI 35.0-39.9 without comorbidity) 11/16/2018   ANEMIA, IRON DEFICIENCY 06/10/2009   OTHER GENERAL SYMPTOMS 06/04/2009   Acute upper respiratory infection 02/05/2009   ACUTE SINUSITIS, UNSPECIFIED 03/03/2008   CERUMEN IMPACTION, BILATERAL 02/18/2008     Current Outpatient Medications on File Prior to Visit  Medication Sig Dispense  Refill   amLODipine (NORVASC) 5 MG tablet Take 1 tablet (5 mg total) by mouth daily. 90 tablet 1   metroNIDAZOLE (FLAGYL) 500 MG tablet Take 1 tablet (500 mg total) by mouth 2 (two) times daily. (Patient not taking: Reported on 12/26/2022) 14 tablet 0   olopatadine (PATANOL) 0.1 % ophthalmic solution Place 1 drop into both eyes 2 (two) times daily. (Patient not taking: Reported on 08/23/2022) 5 mL 0   No current facility-administered medications on file prior to visit.    No Known Allergies  Social History   Socioeconomic History   Marital status: Married    Spouse name: Not on file   Number of children: Not on file   Years of education: Not on file   Highest education level: 9th grade  Occupational History   Not on file  Tobacco Use   Smoking status: Never   Smokeless tobacco: Never  Vaping Use   Vaping status: Never Used  Substance and Sexual Activity   Alcohol use: No   Drug use: No   Sexual activity: Yes    Birth control/protection: Surgical  Other Topics Concern   Not on file  Social History Narrative   Not on file   Social Determinants of Health   Financial Resource Strain: Not on file  Food Insecurity: Not on file  Transportation Needs: No Transportation Needs (01/30/2019)   PRAPARE - Administrator, Civil Service (Medical): No    Lack of Transportation (  Non-Medical): No  Physical Activity: Not on file  Stress: Not on file  Social Connections: Not on file  Intimate Partner Violence: Not on file    Family History  Problem Relation Age of Onset   Diabetes Mother    Hypertension Father    Breast cancer Neg Hx     Past Surgical History:  Procedure Laterality Date   CESAREAN SECTION     TUBAL LIGATION      ROS: Review of Systems Negative except as stated above  PHYSICAL EXAM: BP 126/76 (BP Location: Left Arm, Patient Position: Sitting, Cuff Size: Normal)   Pulse 74   Temp 98 F (36.7 C) (Oral)   Ht 5' (1.524 m)   Wt 196 lb (88.9 kg)    LMP 08/12/2019   SpO2 99%   BMI 38.28 kg/m   Wt Readings from Last 3 Encounters:  12/26/22 196 lb (88.9 kg)  08/23/22 194 lb (88 kg)  06/14/22 193 lb (87.5 kg)    Physical Exam  General appearance - alert, well appearing, middle-age obese Hispanic female and in no distress Mental status - normal mood, behavior, speech, dress, motor activity, and thought processes Chest - clear to auscultation, no wheezes, rales or rhonchi, symmetric air entry Heart - normal rate, regular rhythm, normal S1, S2, no murmurs, rubs, clicks or gallops Extremities - peripheral pulses normal, no pedal edema, no clubbing or cyanosis      Latest Ref Rng & Units 06/14/2022    3:14 PM 06/15/2021    4:28 PM 08/11/2020    9:31 AM  CMP  Glucose 70 - 99 mg/dL 96  93  161   BUN 6 - 24 mg/dL 15  12  10    Creatinine 0.57 - 1.00 mg/dL 0.96  0.45  4.09   Sodium 134 - 144 mmol/L 140  140  141   Potassium 3.5 - 5.2 mmol/L 4.5  4.4  4.4   Chloride 96 - 106 mmol/L 103  103  104   CO2 20 - 29 mmol/L 22  23    Calcium 8.7 - 10.2 mg/dL 9.4  9.4  9.5   Total Protein 6.0 - 8.5 g/dL 7.0  6.9  7.4   Total Bilirubin 0.0 - 1.2 mg/dL 0.2  <8.1  0.3   Alkaline Phos 44 - 121 IU/L 120  110  96   AST 0 - 40 IU/L 33  17  17   ALT 0 - 32 IU/L 54  21     Lipid Panel     Component Value Date/Time   CHOL 199 06/14/2022 1514   TRIG 210 (H) 06/14/2022 1514   HDL 49 06/14/2022 1514   CHOLHDL 4.1 06/14/2022 1514   CHOLHDL 3.1 12/26/2014 0910   VLDL 14 12/26/2014 0910   LDLCALC 113 (H) 06/14/2022 1514    CBC    Component Value Date/Time   WBC 7.2 06/14/2022 1514   WBC 4.9 01/13/2016 1140   RBC 4.59 06/14/2022 1514   RBC 4.76 01/13/2016 1140   HGB 12.5 06/14/2022 1514   HCT 39.5 06/14/2022 1514   PLT 200 06/14/2022 1514   MCV 86 06/14/2022 1514   MCH 27.2 06/14/2022 1514   MCH 28.6 01/13/2016 1140   MCHC 31.6 06/14/2022 1514   MCHC 32.2 01/13/2016 1140   RDW 14.0 06/14/2022 1514   LYMPHSABS 2.4 03/31/2020 1417    MONOABS 245 01/13/2016 1140   EOSABS 0.2 03/31/2020 1417   BASOSABS 0.1 03/31/2020 1417    ASSESSMENT  AND PLAN: 1. Essential hypertension At goal.  Continue amlodipine 5 mg daily. - amLODipine (NORVASC) 5 MG tablet; Take 1 tablet (5 mg total) by mouth daily.  Dispense: 90 tablet; Refill: 1  2. Class 2 severe obesity due to excess calories with serious comorbidity and body mass index (BMI) of 38.0 to 38.9 in adult [facility]) Encouraged her to try to move as much as she can given her work schedule. Encourage healthy eating habits.  She will try to cut back to no more than 5 tortillas a day.  3. Postmenopausal bleeding Even though she has not had any further bleeding, I think this still needs to be evaluated.  She will check with [facility] financial office upstairs to see where things stand in terms of her application for [employer].  If approved, she will let me know so that we can submit the referral to the gynecologist again and for the pelvic ultrasound.  4. Screening for colon cancer - Fecal occult blood, imunochemical([facility])  5. Encounter for screening mammogram for malignant neoplasm of breast - MM Digital Screening; Future  6. Encounter for immunization - Flu vaccine trivalent PF, 6mos and older(Flulaval,Afluria,Fluarix,Fluzone)   Patient was given the opportunity to ask questions.  Patient verbalized understanding of the plan and was able to repeat key elements of the plan.   This documentation was completed using Paediatric nurse.  Any transcriptional errors are unintentional.  No orders of the defined types were placed in this encounter.    Requested Prescriptions   Pending Prescriptions Disp Refills   amLODipine (NORVASC) 5 MG tablet 90 tablet 1    Sig: Take 1 tablet (5 mg total) by mouth daily.    No follow-ups on file.  Jonah Blue, MD, FACP

## 2022-12-27 ENCOUNTER — Other Ambulatory Visit: Payer: Self-pay

## 2022-12-29 LAB — FECAL OCCULT BLOOD, IMMUNOCHEMICAL: Fecal Occult Bld: NEGATIVE

## 2023-01-03 ENCOUNTER — Telehealth: Payer: Self-pay

## 2023-01-03 NOTE — Telephone Encounter (Addendum)
Telephoned patient at mobile number using interpreter, Orie Fisherman. Left voice message with [facility] (scholarship) contact information.  Telephoned patient at mobile number using interpreter, Orie Fisherman. No answer, mail box full at this time.

## 2023-02-07 ENCOUNTER — Other Ambulatory Visit: Payer: Self-pay

## 2023-02-07 ENCOUNTER — Ambulatory Visit: Payer: Self-pay | Admitting: Physician Assistant

## 2023-02-07 ENCOUNTER — Encounter: Payer: Self-pay | Admitting: Physician Assistant

## 2023-02-07 ENCOUNTER — Ambulatory Visit: Payer: Self-pay | Admitting: *Deleted

## 2023-02-07 VITALS — BP 135/77 | HR 70 | Temp 98.3°F | Wt 189.0 lb

## 2023-02-07 DIAGNOSIS — H6123 Impacted cerumen, bilateral: Secondary | ICD-10-CM

## 2023-02-07 DIAGNOSIS — J029 Acute pharyngitis, unspecified: Secondary | ICD-10-CM

## 2023-02-07 MED ORDER — DEBROX 6.5 % OT SOLN: 5.0000 [drp] | Freq: Two times a day (BID) | OTIC | Status: DC

## 2023-02-07 MED ORDER — AZITHROMYCIN 250 MG PO TABS
ORAL_TABLET | ORAL | 0 refills | Status: AC
  Filled 2023-02-07 (×2): qty 6, 5d supply, fill #0

## 2023-02-07 NOTE — Progress Notes (Unsigned)
Established Patient Office Visit  Subjective   Patient ID: Sharon Richardson, female    DOB: 03-23-1970  Age: 52 y.o. MRN: [id number]  Chief Complaint  Patient presents with   Sore Throat    States that she has been experiencing a runny nose, cough and a sore throat for the past 2 weeks.  States the runny nose and cough have mostly resolved but her sore throat continues.  States that it does hurt to swallow.  Denies fever or chills.  States that she is eating and drinking okay but does endorse pain with swallowing.  States that she is using NyQuil and DayQuil without much relief.  States daughter and husband with similar symptoms.  Due to language barrier, an interpreter was present during the history-taking and subsequent discussion (and for part of the physical exam) with this patient.     History reviewed. No pertinent past medical history. Social History   Socioeconomic History   Marital status: Married    Spouse name: Not on file   Number of children: Not on file   Years of education: Not on file   Highest education level: 9th grade  Occupational History   Not on file  Tobacco Use   Smoking status: Never    Passive exposure: Never   Smokeless tobacco: Never  Vaping Use   Vaping status: Never Used  Substance and Sexual Activity   Alcohol use: No   Drug use: No   Sexual activity: Yes    Birth control/protection: Surgical  Other Topics Concern   Not on file  Social History Narrative   Not on file   Social Determinants of Health   Financial Resource Strain: High Risk (12/26/2022)   Overall Financial Resource Strain (CARDIA)    Difficulty of Paying Living Expenses: Very hard  Food Insecurity: No Food Insecurity (12/26/2022)   Hunger Vital Sign    Worried About Running Out of Food in the Last Year: Never true    Ran Out of Food in the Last Year: Never true  Transportation Needs: No Transportation Needs (12/26/2022)   PRAPARE - Scientist, research (physical sciences) (Medical): No    Lack of Transportation (Non-Medical): No  Physical Activity: Inactive (12/26/2022)   Exercise Vital Sign    Days of Exercise per Week: 0 days    Minutes of Exercise per Session: 0 min  Stress: No Stress Concern Present (12/26/2022)   [employer] of Occupational Health - Occupational Stress Questionnaire    Feeling of Stress : Not at all  Social Connections: Socially Integrated (12/26/2022)   Social Connection and Isolation Panel [NHANES]    Frequency of Communication with Friends and Family: Three times a week    Frequency of Social Gatherings with Friends and Family: Three times a week    Attends Religious Services: More than 4 times per year    Active Member of Clubs or Organizations: Yes    Attends Banker Meetings: More than 4 times per year    Marital Status: Married  Catering manager Violence: Not At Risk (12/26/2022)   Humiliation, Afraid, Rape, and Kick questionnaire    Fear of Current or Ex-Partner: No    Emotionally Abused: No    Physically Abused: No    Sexually Abused: No   Family History  Problem Relation Age of Onset   Diabetes Mother    Hypertension Father    Breast cancer Neg Hx    No Known Allergies  Review of Systems  Constitutional:  Negative for chills and fever.  HENT:  Positive for sore throat. Negative for congestion and ear pain.   Eyes: Negative.   Respiratory:  Negative for cough and shortness of breath.   Cardiovascular:  Negative for chest pain.  Gastrointestinal:  Negative for abdominal pain, nausea and vomiting.  Genitourinary: Negative.   Musculoskeletal: Negative.   Skin: Negative.   Endo/Heme/Allergies: Negative.   Psychiatric/Behavioral: Negative.        Objective:     BP 135/77 (BP Location: Left Arm, Patient Position: Sitting, Cuff Size: Large)   Pulse 70   Temp 98.3 F (36.8 C)   Wt 189 lb (85.7 kg)   LMP 08/12/2019   BMI 36.91 kg/m  BP Readings from Last 3 Encounters:   02/07/23 135/77  12/26/22 126/76  08/23/22 117/75   Wt Readings from Last 3 Encounters:  02/07/23 189 lb (85.7 kg)  12/26/22 196 lb (88.9 kg)  08/23/22 194 lb (88 kg)    Physical Exam Vitals and nursing note reviewed.  Constitutional:      Appearance: Normal appearance.  HENT:     Head: Normocephalic.     Salivary Glands: Right salivary gland is diffusely enlarged and tender. Left salivary gland is diffusely enlarged and tender.     Right Ear: There is impacted cerumen.     Left Ear: There is impacted cerumen.     Nose: Nose normal.     Right Turbinates: Not enlarged or swollen.     Left Turbinates: Not enlarged or swollen.     Right Sinus: No maxillary sinus tenderness or frontal sinus tenderness.     Left Sinus: No maxillary sinus tenderness or frontal sinus tenderness.     Mouth/Throat:     Lips: Pink.     Mouth: Mucous membranes are moist.     Pharynx: Posterior oropharyngeal erythema present.     Tonsils: No tonsillar exudate. 1+ on the right. 1+ on the left.  Eyes:     Extraocular Movements: Extraocular movements intact.     Conjunctiva/sclera: Conjunctivae normal.     Pupils: Pupils are equal, round, and reactive to light.  Cardiovascular:     Rate and Rhythm: Normal rate and regular rhythm.     Pulses: Normal pulses.     Heart sounds: Normal heart sounds.  Pulmonary:     Effort: Pulmonary effort is normal.     Breath sounds: Normal breath sounds.  Musculoskeletal:        General: Normal range of motion.     Cervical back: Normal range of motion and neck supple.  Skin:    General: Skin is warm and dry.  Neurological:     General: No focal deficit present.     Mental Status: She is alert and oriented to person, place, and time.  Psychiatric:        Mood and Affect: Mood normal.        Behavior: Behavior normal.        Thought Content: Thought content normal.        Judgment: Judgment normal.         Assessment & Plan:   Problem List Items Addressed  This Visit       Nervous and Auditory   CERUMEN IMPACTION, BILATERAL - Primary   Relevant Medications   carbamide peroxide (DEBROX) 6.5 % OTIC solution   Other Visit Diagnoses     Acute pharyngitis, unspecified etiology       Relevant Medications  azithromycin (ZITHROMAX) 250 MG tablet     1. Acute pharyngitis, unspecified etiology Trial azithromycin.  Patient education given on supportive care.  Red flags given for prompt reevaluation - azithromycin (ZITHROMAX) 250 MG tablet; Take 2 tablets (500 mg total) by mouth daily for 1 day, THEN 1 tablet (250 mg total) daily for 4 days.  Dispense: 6 tablet; Refill: 0  2. Bilateral impacted cerumen Trial Debrox over-the-counter.  Patient education given on supportive care - carbamide peroxide (DEBROX) 6.5 % OTIC solution; Place 5 drops into both ears 2 (two) times daily.   I have reviewed the patient's medical history (PMH, PSH, Social History, Family History, Medications, and allergies) , and have been updated if relevant. I spent 30 minutes reviewing chart and  face to face time with patient.    Return if symptoms worsen or fail to improve.    Kasandra Knudsen Mayers, PA-C

## 2023-02-07 NOTE — Telephone Encounter (Signed)
Message from Phill Myron sent at 02/07/2023  8:32 AM EST  Summary: sore throat   Sore throat, having a little difficulty swallowing 2 weeks.          Call History  Contact Date/Time Type Contact Phone/Fax User  02/07/2023 08:29 AM EST Phone (Incoming) Esquivel-Gutierrez, Langdon (Self) [phone] Judie Petit) Luiz Blare, Carrielelia E   Reason for Disposition  [1] Sore throat with cough/cold symptoms AND [2] present > 5 days  Answer Assessment - Initial Assessment Questions 1. ONSET: "When did the throat start hurting?" (Hours or days ago)      Returned her call using Spanish interpreter [phone] from [employer]. I've had a sore throat for a couple of weeks.    My nose was runny.      I took NyQuil and Dayquil it did help some but the sore throat is still there.  2. SEVERITY: "How bad is the sore throat?" (Scale 1-10; mild, moderate or severe)   - MILD (1-3):  Doesn't interfere with eating or normal activities.   - MODERATE (4-7): Interferes with eating some solids and normal activities.   - SEVERE (8-10):  Excruciating pain, interferes with most normal activities.   - SEVERE WITH DYSPHAGIA (10): Can't swallow liquids, drooling.     I'm having some coughing.    Moderate discomfort with throat. Not coughing up anything. 3. STREP EXPOSURE: "Has there been any exposure to strep within the past week?" If Yes, ask: "What type of contact occurred?"      Not asked 4.  VIRAL SYMPTOMS: "Are there any symptoms of a cold, such as a runny nose, cough, hoarse voice or red eyes?"      Runny nose 5. FEVER: "Do you have a fever?" If Yes, ask: "What is your temperature, how was it measured, and when did it start?"     No 6. PUS ON THE TONSILS: "Is there pus on the tonsils in the back of your throat?"     I have not seen any pus in my throat.    7. OTHER SYMPTOMS: "Do you have any other symptoms?" (e.g., difficulty breathing, headache, rash)     Runny nose 8. PREGNANCY: "Is there any  chance you are pregnant?" "When was your last menstrual period?"     Not asked  Protocols used: Sore Throat-A-AH

## 2023-02-07 NOTE — Telephone Encounter (Signed)
  Chief Complaint: Sore throat for 2 weeks Symptoms: Runny nose and non productive cough with sore throat for 2 weeks Frequency: For 2 weeks Pertinent Negatives: Patient denies fever.   Disposition: [] ED /[] Urgent Care (no appt availability in office) / [] Appointment(In office/virtual)/ []  [place] [facility]/ [] Home Care/ [] Refused Recommended Disposition /[x]  Mobile Bus/ []  Follow-up with PCP Additional Notes: No appts available with any of the providers until Dec. 5th at [facility] so I have referred her to the [facility].   I gave her the location and hours.   I let her know it was first come, first serve and to be there no later than 4:30 today.   She was agreeable to this plan and thanked me for my help.

## 2023-02-07 NOTE — Telephone Encounter (Signed)
Noted  

## 2023-02-07 NOTE — Patient Instructions (Addendum)
To help with your sore throat, you are going to take azithromycin as directed.  To help with your impacted earwax, you are going to use Debrox, you will purchase this over-the-counter.  Please let us know if there is anything else we can do for you  Roney Jaffe, PA-C Physician Assistant [facility] [email or web address]   Faringitis Pharyngitis  La faringitis es inflamacin de la garganta (faringe). Es Neomia Dear causa muy comn de dolor de Advertising copywriter. La faringitis puede ser causada por una bacteria, pero por lo general la provoca un virus. La mayora de los casos de faringitis se curan sin tratamiento. Cules son las causas? Esta afeccin puede ser causada por lo siguiente: Infeccin por virus (viral). La faringitis viral se contagia fcilmente de Neomia Dear persona a otra (es contagiosa) al toser, estornudar y compartir objetos o utensilios personales como tazas, tenedores, cucharas, cepillos de dientes. Infeccin por bacterias (bacteriana). La faringitis bacteriana se puede contagiar al tocarse la nariz o cara luego de entrar en contacto con las bacterias, o a travs de un contacto cercano, como por ejemplo, al besarse. Alergias. Las alergias pueden causar una acumulacin de mucosidad en la garganta (goteo posnasal) que deriva en la inflamacin e irritacin. A su vez, las alergias pueden bloquear las fosas nasales, lo cual hace que se deba respirar por la boca, y esto seca e Insurance claims handler. Qu incrementa el riesgo? Es ms probable que contraiga esta afeccin si: Cleone Slim 5 y 70 aos. Est en lugares muy concurridos, tales como guardera, escuela o vivir en una residencia estudiantil. Vive en un ambiente de clima fro. Tiene debilitado el sistema que combate las enfermedades (inmunitario). Cules son los signos o sntomas? Los sntomas de esta afeccin varan segn la causa. Los sntomas frecuentes de esta afeccin incluyen los  siguientes: Dolor de Advertising copywriter. Fatiga. Fiebre no muy alta. Nariz tapada (congestin nasal) y tos. Dolor de [place]. Otros sntomas pueden incluir lo siguiente: Ganglios en el cuello (ganglios linfticos) que estn hinchados. Erupciones cutneas. Pelcula parecida a las placas en la garganta o amgdalas. Por lo general, esto es un sntoma de faringitis bacteriana. Vmitos. Ojos rojos con picazn (conjuntivitis). Prdida del apetito. Dolores musculares y en las articulaciones. Amgdalas agrandadas. Cmo se diagnostica? Esta afeccin se puede diagnosticar en funcin de los antecedentes mdicos y un examen fsico. El mdico le har preguntas sobre la enfermedad y sus sntomas. Puede que se haga un cultivo de su garganta para buscar bacterias (prueba rpida para estreptococos). Tambin es posible que se realicen otros anlisis de laboratorio, segn la posible causa, aunque esto es poco comn. Cmo se trata? Muchas veces, no se requiere tratamiento para esta afeccin. La faringitis generalmente mejora en 3 o 4 das sin tratamiento. La faringitis bacteriana puede tratarse con antibiticos. Siga estas instrucciones en su casa: Medicamentos Use los medicamentos de venta libre y los recetados solamente como se lo haya indicado el mdico. Si le recetaron un antibitico, tmelo como se lo haya indicado el mdico. No deje de tomar el antibitico, aunque comience a Actor. Use aerosoles para Ecologist se lo haya indicado el mdico. Los nios pueden contraer faringitis. No le d aspirina al nio por el riesgo de que contraiga el sndrome de [place]. Control del dolor Para ayudar a Engineer, materials, intente lo siguiente: Beba lquidos calientes, como caldos, infusiones o agua caliente. Tambin puede comer o beber lquidos fros o congelados, tales como paletas de hielo congelado. Haga grgaras con Burlene Arnt de  agua y sal 3 o 4 veces al da, o cuando sea necesario. Para preparar  agua con sal, disuelva totalmente de  a 1 cucharadita (de 3 a 6 g) de sal en 1 taza (237 ml) de agua tibia. Chupe caramelos duros o pastillas para la garganta. Ponga un humidificador de vapor fro en la habitacin por la noche para humedecer el aire. Tambin puede abrir el agua caliente de la ducha y sentarse en el bao con la puerta cerrada durante 5 a 10 minutos.  Instrucciones generales  No consuma ningn producto que contenga nicotina o tabaco. Estos productos incluyen cigarrillos, tabaco para Theatre manager y aparatos de vapeo, como los Administrator, Civil Service. Si necesita ayuda para dejar de fumar, consulte al mdico. Haga reposo como se lo haya indicado el mdico. Beber suficiente lquido como para Pharmacologist la orina de color amarillo plido. Cmo se evita? Para ayudar a evitar infectarse o que se propague la infeccin: Lvese las manos frecuentemente con agua y jabn durante al menos 20 segundos. Use desinfectante para manos si no dispone de [place] y [place]. No se toque los ojos, la nariz o la boca sin haberse [employer], y [employer] manos despus de tocarse estas zonas. No comparta vasos ni utensilios para comer. Evite el contacto cercano con personas que estn enfermas. Comunquese con un mdico si: Tiene bultos grandes y dolorosos en el cuello. Tiene una erupcin cutnea. Tose con mucosidad de color verde, amarillo amarronado o con [place]. Solicite ayuda de inmediato si: El cuello se pone rgido. Comienza a babear o no puede tragar lquidos. No puede beber ni tomar medicamentos sin vomitar. Siente un dolor intenso que no se va, incluso luego de [employer]. Tiene dificultades para respirar, y no a causa de la congestin nasal. Experimenta un nuevo dolor e hinchazn de las articulaciones como las rodillas, tobillos, [place] o codos. Estos sntomas pueden representar un problema grave que constituye Radio broadcast assistant. No espere a ver si los sntomas desaparecen. Solicite  atencin mdica de inmediato. Comunquese con el servicio de emergencias de su localidad (911 en los [place]). No conduzca por sus propios medios [employer] hospital. Resumen La faringitis ocurre cuando hay enrojecimiento, dolor e hinchazn (inflamacin) en la garganta (faringe). Si bien la faringitis puede ser causada por una bacteria, la causa ms comn son los virus. La mayora de los casos de faringitis se curan sin tratamiento. La faringitis bacteriana se trata con antibiticos. Esta informacin no tiene Theme park manager el consejo del mdico. Asegrese de hacerle al mdico cualquier pregunta que tenga. Document Revised: 07/02/2020 Document Reviewed: 07/02/2020 Elsevier Patient Education  2024 Elsevier Inc.  Acumulacin de cera en el odo en adultos Earwax Buildup, Adult Los odos producen una sustancia que se llama "cera". Esta ayuda a mantener [employer] microbios llamados bacterias y protege la piel de los odos. A veces, puede acumularse una cantidad muy grande de cera. Esto puede causar molestias o dificultar la audicin. Cules son las causas? La acumulacin de cera puede ocurrir cuando tiene una cantidad muy grande de cera en los odos. La cera se produce en la parte externa del canal auditivo. Esta debe desprenderse y salir del odo en pequeas cantidades con el tiempo. Pero si los odos del nio no pueden limpiarse como deberan, la cera puede acumularse. Qu incrementa el riesgo? Es ms probable que se le produzca una acumulacin de cera si: Se limpia los odos con hisopos de algodn. Se toca los odos. [place] tapones para los odos  o auriculares internos con mucha frecuencia. [place] audfonos. Tambin es ms probable que se le produzca si: Es hombre. Es Neomia Dear persona de edad avanzada. Los odos producen ms cera por motivos naturales. Tiene canales auditivos estrechos o vello [employer] odos. Su cera es demasiado espesa o pegajosa. Tiene eccema. Se deshidrata.  Esto significa que no tiene una cantidad suficiente de lquido en el cuerpo. Cules son los signos o sntomas? Los sntomas de acumulacin de cera incluyen los siguientes: No poder or bien. Sensacin de [employer] odo tapado. Sensacin de que el odo est tapado. Secrecin de lquido. Dolor o Development worker, community odo. Zumbidos en el odo. Tos o problemas de equilibrio. Cmo se diagnostica? La acumulacin de cera se puede diagnosticar en funcin de los sntomas, los antecedentes mdicos y un examen del odo. Durante el examen, el mdico mirar dentro de su odo con un instrumento llamado otoscopio. Tambin pueden [employer], como una prueba de la audicin. Cmo se trata? La acumulacin de cera puede tratarse de las siguientes maneras: Uso de gotas ticas. Extraccin de cera realizada por un mdico. El mdico puede hacer lo siguiente: Enjuagar el odo con agua. Usar una herramienta llamada cureta que tiene un bucle en el extremo. Usar un dispositivo de succin. Someterse a [place]. Esto puede Facilities manager graves. Siga estas indicaciones en su casa:  Limpieza de los odos [employer] odos como se lo haya indicado el mdico. Puede limpiarle la parte externa de los odos con un pao o un pauelo de papel. No se limpie los odos en exceso. No se introduzca nada en el odo, salvo que se lo indique el mdico. Esto incluye los hisopos de algodn. Indicaciones generales Use los medicamentos de venta libre y los recetados solamente como se lo haya indicado el mdico. Beba suficiente lquido para Pharmacologist el pis (orina) de color amarillo plido. Esto ayuda a Industrial/product designer. Si tiene audfonos, lmpielos como se lo hayan indicado. Concurra a todas las visitas de seguimiento. Si la cera se le acumula en los odos con frecuencia o si [place] audfonos, pregntele al mdico con qu frecuencia debe hacerse limpiar los odos. Comunquese con un mdico si: El dolor en el odo empeora. Tiene  fiebre. Le sale pus, sangre u otro lquido del odo. Tiene prdida de la audicin. Tiene zumbidos en el odo que no desaparecen. Siente que la Biochemist, clinical vueltas. Esto se denomina vrtigo. Los sntomas no mejoran con Scientist, research (medical). Esta informacin no tiene Theme park manager el consejo del mdico. Asegrese de hacerle al mdico cualquier pregunta que tenga. Document Revised: 07/13/2022 Document Reviewed: 07/13/2022 Elsevier Patient Education  2024 [employer].

## 2023-02-08 ENCOUNTER — Other Ambulatory Visit: Payer: Self-pay

## 2023-02-10 ENCOUNTER — Other Ambulatory Visit: Payer: Self-pay

## 2023-05-18 ENCOUNTER — Ambulatory Visit
Admission: RE | Admit: 2023-05-18 | Discharge: 2023-05-18 | Disposition: A | Discharge status: Home / Self Care | Payer: No Typology Code available for payment source | Source: Ambulatory Visit | Attending: Internal Medicine | Admitting: Internal Medicine

## 2023-05-18 DIAGNOSIS — Z1231 Encounter for screening mammogram for malignant neoplasm of breast: Secondary | ICD-10-CM

## 2023-05-19 ENCOUNTER — Other Ambulatory Visit: Payer: Self-pay | Admitting: Internal Medicine

## 2023-05-19 DIAGNOSIS — Z1231 Encounter for screening mammogram for malignant neoplasm of breast: Secondary | ICD-10-CM

## 2023-05-19 DIAGNOSIS — Z1211 Encounter for screening for malignant neoplasm of colon: Secondary | ICD-10-CM

## 2023-05-19 DIAGNOSIS — I10 Essential (primary) hypertension: Secondary | ICD-10-CM

## 2023-05-19 DIAGNOSIS — N950 Postmenopausal bleeding: Secondary | ICD-10-CM

## 2023-05-19 DIAGNOSIS — Z23 Encounter for immunization: Secondary | ICD-10-CM

## 2023-05-31 ENCOUNTER — Ambulatory Visit: Payer: Self-pay | Admitting: Physician Assistant

## 2023-06-15 ENCOUNTER — Ambulatory Visit: Payer: Self-pay | Attending: Internal Medicine | Admitting: Internal Medicine

## 2023-06-15 ENCOUNTER — Other Ambulatory Visit: Payer: Self-pay

## 2023-06-15 ENCOUNTER — Encounter: Payer: Self-pay | Admitting: Internal Medicine

## 2023-06-15 VITALS — BP 107/70 | HR 78 | Temp 98.1°F | Ht 60.0 in | Wt 197.0 lb

## 2023-06-15 DIAGNOSIS — M79672 Pain in left foot: Secondary | ICD-10-CM

## 2023-06-15 DIAGNOSIS — I10 Essential (primary) hypertension: Secondary | ICD-10-CM

## 2023-06-15 DIAGNOSIS — G8929 Other chronic pain: Secondary | ICD-10-CM

## 2023-06-15 DIAGNOSIS — R252 Cramp and spasm: Secondary | ICD-10-CM

## 2023-06-15 MED ORDER — AMLODIPINE BESYLATE 5 MG PO TABS
5.0000 mg | ORAL_TABLET | Freq: Every day | ORAL | 1 refills | Status: DC
  Filled 2023-06-15: qty 90, 90d supply, fill #0
  Filled 2024-02-12: qty 90, 90d supply, fill #1

## 2023-06-15 NOTE — Progress Notes (Signed)
 Patient ID: Sharon Richardson, female    DOB: 07-07-1970  MRN: [id number]  CC: Foot Pain (Foot pain on R & L foot X 6-8 mo /Already received flu vax)   Subjective: Sharon Richardson is a 53 y.o. female who presents for UC visit Her concerns today include:  Pt with hx of obesity, preDM, HL, HTN, Vit D def   AMN Language interpreter used during this encounter. #Jair, [id number]  Discussed the use of AI scribe software for clinical note transcription with the patient, who gave verbal consent to proceed.  History of Present Illness   Sharon Richardson is a 53 year old female who presents with bilateral foot pain.  She has been experiencing bilateral foot pain, initially starting in the left foot and recently affecting the right foot. The pain is located on the back of the heel and is more pronounced after a day of work when she rests and then stands up again. There is no history of injury to the left foot, and changing shoes did not alleviate the pain.  The left foot pain has been present for several months, with the pain described as coming and going. She has started taking multivitamins and feels there might be a slight improvement in her symptoms.  The right foot pain has recently begun and is similar in nature to the left foot pain, located at the back of the heel and more noticeable after resting and standing up.  She also describes pain the calf on the RT side, which occurs when transitioning from sitting to standing ans when she lays down for a long time and then tries to get up. She is not currently taking any medication for the pain as she does not feel it is necessary.     Still taking amlodipine for blood pressure.  Request refill. Patient Active Problem List   Diagnosis Date Noted   Vitamin D deficiency 03/30/2020   Acute non intractable tension-type headache 03/30/2020   Prediabetes 02/03/2020   Acute pain of right knee 02/03/2020   Essential  hypertension 02/03/2020   Screening breast examination 01/30/2019   Elevated blood pressure reading 11/16/2018   Obesity (BMI 35.0-39.9 without comorbidity) 11/16/2018   ANEMIA, IRON DEFICIENCY 06/10/2009   OTHER GENERAL SYMPTOMS 06/04/2009   Acute upper respiratory infection 02/05/2009   ACUTE SINUSITIS, UNSPECIFIED 03/03/2008   CERUMEN IMPACTION, BILATERAL 02/18/2008     Current Outpatient Medications on File Prior to Visit  Medication Sig Dispense Refill   amLODipine (NORVASC) 5 MG tablet Take 1 tablet (5 mg total) by mouth daily. 90 tablet 1   carbamide peroxide (DEBROX) 6.5 % OTIC solution Place 5 drops into both ears 2 (two) times daily. (Patient not taking: Reported on 06/15/2023)     olopatadine (PATANOL) 0.1 % ophthalmic solution Place 1 drop into both eyes 2 (two) times daily. (Patient not taking: Reported on 06/15/2023) 5 mL 0   No current facility-administered medications on file prior to visit.    No Known Allergies  Social History   Socioeconomic History   Marital status: Married    Spouse name: Not on file   Number of children: Not on file   Years of education: Not on file   Highest education level: 9th grade  Occupational History   Not on file  Tobacco Use   Smoking status: Never    Passive exposure: Never   Smokeless tobacco: Never  Vaping Use   Vaping status: Never Used  Substance and Sexual Activity  Alcohol use: No   Drug use: No   Sexual activity: Yes    Birth control/protection: Surgical  Other Topics Concern   Not on file  Social History Narrative   Not on file   Social Drivers of Health   Financial Resource Strain: Medium Risk (06/15/2023)   Overall Financial Resource Strain (CARDIA)    Difficulty of Paying Living Expenses: Somewhat hard  Food Insecurity: No Food Insecurity (06/15/2023)   Hunger Vital Sign    Worried About Running Out of Food in the Last Year: Never true    Ran Out of Food in the Last Year: Never true  Transportation  Needs: No Transportation Needs (06/15/2023)   PRAPARE - Administrator, Civil Service (Medical): No    Lack of Transportation (Non-Medical): No  Physical Activity: Inactive (06/15/2023)   Exercise Vital Sign    Days of Exercise per Week: 0 days    Minutes of Exercise per Session: 0 min  Stress: No Stress Concern Present (06/15/2023)   [employer] of Occupational Health - Occupational Stress Questionnaire    Feeling of Stress : Not at all  Social Connections: Socially Integrated (06/15/2023)   Social Connection and Isolation Panel [NHANES]    Frequency of Communication with Friends and Family: More than three times a week    Frequency of Social Gatherings with Friends and Family: More than three times a week    Attends Religious Services: More than 4 times per year    Active Member of [employer] or Organizations: Yes    Attends Banker Meetings: 1 to 4 times per year    Marital Status: Married  Catering manager Violence: Not At Risk (06/15/2023)   Humiliation, Afraid, Rape, and Kick questionnaire    Fear of Current or Ex-Partner: No    Emotionally Abused: No    Physically Abused: No    Sexually Abused: No    Family History  Problem Relation Age of Onset   Diabetes Mother    Hypertension Father    Breast cancer Neg Hx     Past Surgical History:  Procedure Laterality Date   CESAREAN SECTION     TUBAL LIGATION      ROS: Review of Systems Negative except as stated above  PHYSICAL EXAM: BP 107/70 (BP Location: Left Arm, Patient Position: Sitting, Cuff Size: Normal)   Pulse 78   Temp 98.1 F (36.7 C) (Oral)   Ht 5' (1.524 m)   Wt 197 lb (89.4 kg)   LMP 08/12/2019   SpO2 98%   BMI 38.47 kg/m   Physical Exam  General appearance - alert, well appearing, and in no distress Mental status - normal mood, behavior, speech, dress, motor activity, and thought processes Musculoskeletal -left foot: Posterior calcaneal bone appears a little prominent.  She  has mild tenderness on palpation over the prominence and on either side of the Achilles insertion.  She has good range of motion of the ankle.  Right foot: No point tenderness on palpation of the posterior calcaneal bone or at insertion points of the Achilles. No lower extremity edema.  No tenderness on palpation of the calf.  Legs are warm.      Latest Ref Rng & Units 06/14/2022    3:14 PM 06/15/2021    4:28 PM 08/11/2020    9:31 AM  CMP  Glucose 70 - 99 mg/dL 96  93  756   BUN 6 - 24 mg/dL 15  12  10  Creatinine 0.57 - 1.00 mg/dL 4.54  0.98  1.19   Sodium 134 - 144 mmol/L 140  140  141   Potassium 3.5 - 5.2 mmol/L 4.5  4.4  4.4   Chloride 96 - 106 mmol/L 103  103  104   CO2 20 - 29 mmol/L 22  23    Calcium 8.7 - 10.2 mg/dL 9.4  9.4  9.5   Total Protein 6.0 - 8.5 g/dL 7.0  6.9  7.4   Total Bilirubin 0.0 - 1.2 mg/dL 0.2  <1.4  0.3   Alkaline Phos 44 - 121 IU/L 120  110  96   AST 0 - 40 IU/L 33  17  17   ALT 0 - 32 IU/L 54  21     Lipid Panel     Component Value Date/Time   CHOL 199 06/14/2022 1514   TRIG 210 (H) 06/14/2022 1514   HDL 49 06/14/2022 1514   CHOLHDL 4.1 06/14/2022 1514   CHOLHDL 3.1 12/26/2014 0910   VLDL 14 12/26/2014 0910   LDLCALC 113 (H) 06/14/2022 1514    CBC    Component Value Date/Time   WBC 7.2 06/14/2022 1514   WBC 4.9 01/13/2016 1140   RBC 4.59 06/14/2022 1514   RBC 4.76 01/13/2016 1140   HGB 12.5 06/14/2022 1514   HCT 39.5 06/14/2022 1514   PLT 200 06/14/2022 1514   MCV 86 06/14/2022 1514   MCH 27.2 06/14/2022 1514   MCH 28.6 01/13/2016 1140   MCHC 31.6 06/14/2022 1514   MCHC 32.2 01/13/2016 1140   RDW 14.0 06/14/2022 1514   LYMPHSABS 2.4 03/31/2020 1417   MONOABS 245 01/13/2016 1140   EOSABS 0.2 03/31/2020 1417   BASOSABS 0.1 03/31/2020 1417    ASSESSMENT AND PLAN: 1. Muscle cramp, nocturnal (Primary) Intermittent right calf cramping, likely muscle spasms. Possible electrolyte imbalance. - Advise small teaspoon of mustard in evenings  to reduce cramps. - Order lab tests for electrolytes, including potassium and calcium. - Basic Metabolic Panel  2. Essential hypertension At goal - amLODipine (NORVASC) 5 MG tablet; Take 1 tablet (5 mg total) by mouth daily.  Dispense: 90 tablet; Refill: 1  3. Heel pain, chronic, left Chronic heel pain in left foot, recent onset in right. Pain at back of heel, worse after rest. Possible Achilles tendinitis or retrocalcaneal bursitis. - Recommend Doctor Scholl's round pads to prevent heel bone rubbing. - Refer to podiatrist for further evaluation. - Ambulatory referral to Podiatry    Patient was given the opportunity to ask questions.  Patient verbalized understanding of the plan and was able to repeat key elements of the plan.   This documentation was completed using Paediatric nurse.  Any transcriptional errors are unintentional.  No orders of the defined types were placed in this encounter.    Requested Prescriptions   Pending Prescriptions Disp Refills   amLODipine (NORVASC) 5 MG tablet 90 tablet 1    Sig: Take 1 tablet (5 mg total) by mouth daily.    No follow-ups on file.  Jonah Blue, MD, FACP

## 2023-06-16 ENCOUNTER — Other Ambulatory Visit: Payer: Self-pay

## 2023-06-16 LAB — BASIC METABOLIC PANEL WITH GFR
BUN/Creatinine Ratio: 29 — ABNORMAL HIGH (ref 9–23)
BUN: 19 mg/dL (ref 6–24)
CO2: 26 mmol/L (ref 20–29)
Calcium: 9.3 mg/dL (ref 8.7–10.2)
Chloride: 103 mmol/L (ref 96–106)
Creatinine, Ser: 0.65 mg/dL (ref 0.57–1.00)
Glucose: 99 mg/dL (ref 70–99)
Potassium: 4.3 mmol/L (ref 3.5–5.2)
Sodium: 141 mmol/L (ref 134–144)
eGFR: 106 mL/min/{1.73_m2} (ref >59)

## 2023-06-27 ENCOUNTER — Ambulatory Visit: Payer: Self-pay | Admitting: Podiatry

## 2023-12-27 ENCOUNTER — Telehealth: Payer: Self-pay | Admitting: Internal Medicine

## 2023-12-27 NOTE — Telephone Encounter (Signed)
 Patient confirmed appointment for 12/29/2023 through volunteer call.

## 2023-12-29 ENCOUNTER — Ambulatory Visit: Payer: Self-pay | Attending: Internal Medicine | Admitting: Internal Medicine

## 2023-12-29 ENCOUNTER — Encounter: Payer: Self-pay | Admitting: Internal Medicine

## 2023-12-29 VITALS — BP 125/77 | HR 74 | Temp 97.8°F | Ht 60.0 in | Wt 201.0 lb

## 2023-12-29 DIAGNOSIS — M79672 Pain in left foot: Secondary | ICD-10-CM

## 2023-12-29 DIAGNOSIS — Z1211 Encounter for screening for malignant neoplasm of colon: Secondary | ICD-10-CM

## 2023-12-29 DIAGNOSIS — Z6839 Body mass index (BMI) 39.0-39.9, adult: Secondary | ICD-10-CM

## 2023-12-29 DIAGNOSIS — E6609 Other obesity due to excess calories: Secondary | ICD-10-CM

## 2023-12-29 DIAGNOSIS — Z23 Encounter for immunization: Secondary | ICD-10-CM

## 2023-12-29 DIAGNOSIS — G8929 Other chronic pain: Secondary | ICD-10-CM

## 2023-12-29 DIAGNOSIS — E66812 Obesity, class 2: Secondary | ICD-10-CM

## 2023-12-29 NOTE — Progress Notes (Signed)
 Patient ID: Sharon Richardson, female    DOB: 1970/07/06  MRN: [id number]  CC: Foot Pain (Bilateral foot pain X6 mo - swelling/Flu vax administered on 12/29/2023 - C.A.)   Subjective: Sharon Richardson is a 53 y.o. female who presents for chronic ds management. Daughter Eleanor is with her Her concerns today include:  Pt with hx of obesity, preDM, HL, HTN, Vit D def   AMN Language interpreter used during this encounter. #Maria [id number]  Discussed the use of AI scribe software for clinical note transcription with the patient, who gave verbal consent to proceed.  History of Present Illness Sharon Richardson is a 53 year old female who presents with bilateral foot pain for over six months.  She has been experiencing bilateral foot pain for over six months, with the left foot being more affected at this time. The pain is described as a 'needles' sensation, particularly when standing for extended periods, and is localized to the posterior heel area. No pain is reported when wearing closed shoes, and the pain does not interfere with her sleep. Increased pain is noted when standing and walking for long periods.  She was seen for the same in March of this year and was referred to podiatry.  However she was not able to keep the appointment due to cost.  She has since been approved with the [facility] discount.  She is concerned about her weight gain.  She has gained 12 pounds over the past year, with her weight increasing from 189 pounds to 201 pounds. She attributes this weight gain to poor dietary habits, including daily consumption of soda and coffee, and eating six to seven tortillas a day. She is not currently engaging in regular exercise.    Patient Active Problem List   Diagnosis Date Noted   Vitamin D  deficiency 03/30/2020   Acute non intractable tension-type headache 03/30/2020   Prediabetes 02/03/2020   Acute pain of right knee 02/03/2020    Essential hypertension 02/03/2020   Screening breast examination 01/30/2019   Elevated blood pressure reading 11/16/2018   Obesity (BMI 35.0-39.9 without comorbidity) 11/16/2018   ANEMIA, IRON DEFICIENCY 06/10/2009   OTHER GENERAL SYMPTOMS 06/04/2009   Acute upper respiratory infection 02/05/2009   ACUTE SINUSITIS, UNSPECIFIED 03/03/2008   CERUMEN IMPACTION, BILATERAL 02/18/2008     Current Outpatient Medications on File Prior to Visit  Medication Sig Dispense Refill   amLODipine  (NORVASC ) 5 MG tablet Take 1 tablet (5 mg total) by mouth daily. 90 tablet 1   No current facility-administered medications on file prior to visit.    No Known Allergies  Social History   Socioeconomic History   Marital status: Married    Spouse name: Not on file   Number of children: Not on file   Years of education: Not on file   Highest education level: 9th grade  Occupational History   Not on file  Tobacco Use   Smoking status: Never    Passive exposure: Never   Smokeless tobacco: Never  Vaping Use   Vaping status: Never Used  Substance and Sexual Activity   Alcohol use: No   Drug use: No   Sexual activity: Yes    Birth control/protection: Surgical  Other Topics Concern   Not on file  Social History Narrative   Not on file   Social Drivers of Health   Financial Resource Strain: Medium Risk (06/15/2023)   Overall Financial Resource Strain (CARDIA)    Difficulty of Paying Living Expenses: Somewhat  hard  Food Insecurity: No Food Insecurity (06/15/2023)   Hunger Vital Sign    Worried About Running Out of Food in the Last Year: Never true    Ran Out of Food in the Last Year: Never true  Transportation Needs: No Transportation Needs (06/15/2023)   PRAPARE - Administrator, Civil Service (Medical): No    Lack of Transportation (Non-Medical): No  Physical Activity: Inactive (06/15/2023)   Exercise Vital Sign    Days of Exercise per Week: 0 days    Minutes of Exercise per  Session: 0 min  Stress: No Stress Concern Present (06/15/2023)   [employer] of Occupational Health - Occupational Stress Questionnaire    Feeling of Stress : Not at all  Social Connections: Socially Integrated (06/15/2023)   Social Connection and Isolation Panel    Frequency of Communication with Friends and Family: More than three times a week    Frequency of Social Gatherings with Friends and Family: More than three times a week    Attends Religious Services: More than 4 times per year    Active Member of [employer] or Organizations: Yes    Attends Banker Meetings: 1 to 4 times per year    Marital Status: Married  Catering manager Violence: Not At Risk (06/15/2023)   Humiliation, Afraid, Rape, and Kick questionnaire    Fear of Current or Ex-Partner: No    Emotionally Abused: No    Physically Abused: No    Sexually Abused: No    Family History  Problem Relation Age of Onset   Diabetes Mother    Hypertension Father    Breast cancer Neg Hx     Past Surgical History:  Procedure Laterality Date   CESAREAN SECTION     TUBAL LIGATION      ROS: Review of Systems Negative except as stated above  PHYSICAL EXAM: BP 125/77 (BP Location: Left Arm, Patient Position: Sitting, Cuff Size: Normal)   Pulse 74   Temp 97.8 F (36.6 C) (Oral)   Ht 5' (1.524 m)   Wt 201 lb (91.2 kg)   LMP 08/12/2019   SpO2 98%   BMI 39.26 kg/m   Wt Readings from Last 3 Encounters:  12/29/23 201 lb (91.2 kg)  06/15/23 197 lb (89.4 kg)  02/07/23 189 lb (85.7 kg)  ' Physical Exam  General appearance - alert, well appearing, and in no distress Mental status - normal mood, behavior, speech, dress, motor activity, and thought processes Chest - clear to auscultation, no wheezes, rales or rhonchi, symmetric air entry Heart - normal rate, regular rhythm, normal S1, S2, no murmurs, rubs, clicks or gallops Musculoskeletal - left foot: Posterior calcaneal bone is prominent/enlarged and  protrudes. She has mild tenderness on palpation over the prominence and on either side of the Achilles insertion. She has good range of motion of the ankle. Right foot: No point tenderness on palpation of the posterior calcaneal bone or at insertion points of the Achilles.       Latest Ref Rng & Units 06/15/2023    3:25 PM 06/14/2022    3:14 PM 06/15/2021    4:28 PM  CMP  Glucose 70 - 99 mg/dL 99  96  93   BUN 6 - 24 mg/dL 19  15  12    Creatinine 0.57 - 1.00 mg/dL 9.34  9.23  9.36   Sodium 134 - 144 mmol/L 141  140  140   Potassium 3.5 - 5.2 mmol/L 4.3  4.5  4.4   Chloride 96 - 106 mmol/L 103  103  103   CO2 20 - 29 mmol/L 26  22  23    Calcium 8.7 - 10.2 mg/dL 9.3  9.4  9.4   Total Protein 6.0 - 8.5 g/dL  7.0  6.9   Total Bilirubin 0.0 - 1.2 mg/dL  0.2  <9.7   Alkaline Phos 44 - 121 IU/L  120  110   AST 0 - 40 IU/L  33  17   ALT 0 - 32 IU/L  54  21    Lipid Panel     Component Value Date/Time   CHOL 199 06/14/2022 1514   TRIG 210 (H) 06/14/2022 1514   HDL 49 06/14/2022 1514   CHOLHDL 4.1 06/14/2022 1514   CHOLHDL 3.1 12/26/2014 0910   VLDL 14 12/26/2014 0910   LDLCALC 113 (H) 06/14/2022 1514    CBC    Component Value Date/Time   WBC 7.2 06/14/2022 1514   WBC 4.9 01/13/2016 1140   RBC 4.59 06/14/2022 1514   RBC 4.76 01/13/2016 1140   HGB 12.5 06/14/2022 1514   HCT 39.5 06/14/2022 1514   PLT 200 06/14/2022 1514   MCV 86 06/14/2022 1514   MCH 27.2 06/14/2022 1514   MCH 28.6 01/13/2016 1140   MCHC 31.6 06/14/2022 1514   MCHC 32.2 01/13/2016 1140   RDW 14.0 06/14/2022 1514   LYMPHSABS 2.4 03/31/2020 1417   MONOABS 245 01/13/2016 1140   EOSABS 0.2 03/31/2020 1417   BASOSABS 0.1 03/31/2020 1417    ASSESSMENT AND PLAN: 1. Heel pain, chronic, left (Primary) Some deformity and discomfort of the posterior calcaneal bone. - Ambulatory referral to Podiatry  2. Class 2 obesity due to excess calories without serious comorbidity with body mass index (BMI) of 39.0 to 39.9 in  adult Patient advised to eliminate sugary drinks from the diet, cut back on portion sizes especially of white carbohydrates, eat more white lean meat like chicken [place] and seafood instead of beef or pork and incorporate fresh fruits and vegetables into the diet daily.  Encouraged her to cut back on the tortillas Encouraged her to try to walk at least 3 days a week for 30 minutes.  3. Need for immunization against influenza  - Flu vaccine trivalent PF, 6mos and older(Flulaval,Afluria,Fluarix,Fluzone)  4. Screening for colon cancer - Fecal occult blood, imunochemical([facility]/Sunquest)    Patient was given the opportunity to ask questions.  Patient verbalized understanding of the plan and was able to repeat key elements of the plan.   This documentation was completed using Paediatric nurse.  Any transcriptional errors are unintentional.  Orders Placed This Encounter  Procedures   Fecal occult blood, imunochemical(Labcorp/Sunquest)   Flu vaccine trivalent PF, 6mos and older(Flulaval,Afluria,Fluarix,Fluzone)   Ambulatory referral to Podiatry     Requested Prescriptions    No prescriptions requested or ordered in this encounter    Return if symptoms worsen or fail to improve.  Barnie Louder, MD, FACP

## 2023-12-29 NOTE — Patient Instructions (Signed)
 Alimentacin saludable en los [employer], Adult Una alimentacin saludable puede ayudarlo a Barista y Pharmacologist un peso saludable, reducir el riesgo de tener enfermedades crnicas y vivir Neomia Dear vida larga y productiva. Es importante que siga una modalidad de alimentacin saludable. Sus necesidades nutricionales y calricas deben satisfacerse principalmente con distintos alimentos ricos en nutrientes. Consejos para seguir Surveyor, minerals Lea las etiquetas de los alimentos Lea las etiquetas y elija las que digan lo siguiente: Productos reducidos en sodio o con bajo contenido de [place]. Jugos con 100 % jugo de fruta. Alimentos con bajo contenido de grasas saturadas (menos de 3 g por porcin) y alto contenido de grasas poliinsaturadas y Mining engineer. Alimentos con cereales integrales, como trigo integral, trigo partido, arroz integral y arroz salvaje. Cereales integrales fortificados con cido flico. Esto se recomienda a las mujeres embarazadas o que desean quedar embarazadas. Lea las etiquetas y no coma ni beba lo siguiente: Alimentos o bebidas con azcar agregada. Estos incluyen los alimentos que contienen azcar moreno, endulzante a base de maz, jarabe de maz, dextrosa, fructosa, glucosa, jarabe de maz de alta fructosa, miel, azcar invertido, lactosa, jarabe de [place], maltosa, Sunflower, azcar sin refinar, sacarosa, trehalosa y azcar turbinado. Limite el consumo de azcar agregada a menos del 10 % del total de caloras diarias. No consuma ms que las siguientes cantidades de azcar agregada por da: 6 cucharaditas (25 g) para las mujeres. 9 cucharaditas (38 g) para los hombres. Los alimentos que contienen almidones y cereales refinados o procesados. Los productos de cereales refinados, como harina blanca, harina de maz desgerminada, pan blanco y arroz blanco. Al ir de compras Elija refrigerios ricos en nutrientes, como verduras, frutas enteras y frutos secos. Evite los refrigerios con  alto contenido de caloras y International aid/development worker, como las papas fritas, los refrigerios frutales y los caramelos. Use alios y productos para untar a base de aceite con los Publishing rights manager de grasas slidas como la [place], la [place], la crema agria o el queso crema. Limite las salsas, las mezclas y los productos "instantneos" preelaborados como el arroz saborizado, los fideos instantneos y las pastas listas para comer. Pruebe ms fuentes de protena vegetal, como tofu, tempeh, frijoles negros, edamame, lentejas, frutos secos y semillas. Explore planes de alimentacin como la dieta mediterrnea o la dieta vegetariana. Pruebe salsas cardiosaludables hechas con frijoles y grasas saludables, como hummus y [place]. Las verduras van muy bien con ellas. Al cocinar Use aceite para Designer, multimedia de grasas slidas como [place], margarina o [place] de [place]. En lugar de frer, trate de cocinar en el horno, en la plancha o en la parrilla, o hervir los alimentos. Retire la parte grasa de las carnes antes de cocinarlas. Cocine las verduras al vapor en agua o caldo. Planificacin de las comidas  En las comidas, imagine dividir su plato en cuartos: La mitad del plato tiene frutas y verduras. Un cuarto del plato tiene cereales integrales. Un cuarto del plato tiene protena, especialmente carnes [place], aves, huevos, tofu, frijoles o frutos secos. Incluya lcteos descremados en su dieta diaria. Estilo de vida Elija opciones saludables en todos los mbitos, como en el hogar, el [place], la [place], los restaurantes y [place]. Prepare los alimentos de un modo seguro: Lvese las manos despus de manipular carnes crudas. Donde prepare alimentos, mantenga las superficies limpias lavndolas regularmente con agua caliente y [place]. Mantenga las carnes crudas separadas de los alimentos que estn listos para comer como las frutas y las verduras. Cocine los frutos  de mar, carnes, aves y Loss adjuster, chartered la temperatura recomendada. Consiga un termmetro para alimentos. Almacene los alimentos a temperaturas seguras. En general: Mantenga los alimentos fros a una temperatura de 40 F (4,4 C) o inferior. Mantenga los alimentos calientes a una temperatura de 140 F (60 C) o superior. Mantenga el congelador a una temperatura de 0 F (-17,8 C) o inferior. Los alimentos no son seguros para su consumo cuando han estado a una temperatura de entre 40 y 140 F (4.4 y 60 C) por ms de 2 horas. Qu alimentos debo comer? Frutas Propngase comer entre 1 y 2 tazas de frutas frescas, Primary school teacher (en su jugo natural) o Primary school teacher. Una taza de fruta equivale a 1 manzana pequea, 1 banana grande, 8 fresas grandes, 1 taza (237 g) de fruta enlatada,  taza (82 g) de fruta seca o 1 taza (240 ml) de jugo al 100 %. Verduras Propngase comer de 2 a 4 tazas de verduras frescas y Primary school teacher, incluyendo diferentes variedades y colores. Una taza de verduras equivale a 1 taza (91 g) de brcoli o coliflor, 2 zanahorias medianas, 2 tazas (150 g) de verduras de [employer] crudas, 1 tomate grande, 1 pimiento morrn grande, 1 batata grande o 1 patata blanca mediana. Cereales Propngase comer el equivalente a entre 4 y 10 onzas de cereales integrales por Futures trader. Algunos ejemplos de equivalentes a 1 onza de cereales son 1 rebanada de pan, 1 taza (40 g) de cereal listo para comer, 3 tazas (24 g) de palomitas de maz o  taza (93 g) de arroz cocido. Carnes y otras protenas Propngase comer el equivalente a entre 5 y 7  onzas de protena por Futures trader. Algunos ejemplos de equivalentes a 1 onza de protenas incluyen 1 huevo,  oz de frutos secos (12 almendras, 24 pistachos o 7 mitades de nueces), 1/4 taza (90 g) de frijoles cocidos, 6 cucharadas (90 g) de hummus o 1 cucharada (16 g) de [place] de man. Un corte de carne o pescado del tamao de un mazo de cartas equivale aproximadamente a 3 a 4 onzas (85  g). De las protenas que consume cada semana, intente que al menos 8 onzas (227 g) sean frutos de mar. Esto equivale a unas 2 porciones por semana. Esto incluye salmn, trucha, arenque y anchoas. Lcteos [employer] a 3 tazas de lcteos descremados o con bajo contenido de Museum/gallery curator. Algunos ejemplos de equivalentes a 1 taza de lcteos son 1 taza (240 ml) de leche, 8 onzas (250 g) de yogur, 1 onzas (44 g) de queso natural o 1 taza (240 ml) de leche de soja fortificada. Grasas y aceites Propngase consumir alrededor de 5 cucharaditas (21 g) de grasas y Acupuncturist. Elija grasas monoinsaturadas, como el aceite de canola y de [place], la [place] con aceite de [place] o de [place], [place], [place] de man y Games developer de los frutos secos, o bien grasas poliinsaturadas, como el aceite de [place], maz y soja, nueces, piones, semillas de ssamo, semillas de girasol y semillas de lino. Bebidas Propngase beber 6 vasos de 8 onzas de Warehouse manager. Limite el caf a entre 3 y 5 tazas de ocho onzas por Futures trader. Limite el consumo de bebidas con cafena que tengan caloras agregadas, como los refrescos y las bebidas energizantes. Si bebe alcohol: Limite la cantidad que bebe a lo siguiente: De 0 a 1 medida al da si es [place]. De 0 a 2 medidas  al da si es varn. Sepa cunta cantidad de alcohol hay en las bebidas que toma. En los [address], una medida es una botella de cerveza de 12 oz (355 ml), un vaso de vino de 5 oz (148 ml) o un vaso de una bebida alcohlica de alta graduacin de 1 oz (44 ml). Condimentos y otros alimentos Trate de no agregar demasiada sal a los alimentos. Trate de usar hierbas y especias en lugar de sal. Trate de no agregar azcar a los alimentos. Esta informacin se basa en las pautas de nutricin de los [place]. Para obtener ms informacin, visite [email or web address]. Las Information systems manager. Es posible que necesite cantidades  diferentes. Esta informacin no tiene Theme park manager el consejo del mdico. Asegrese de hacerle al mdico cualquier pregunta que tenga. Document Revised: 01/04/2022 Document Reviewed: 01/04/2022 Elsevier Patient Education  2024 [employer].

## 2024-01-08 ENCOUNTER — Encounter: Payer: Self-pay | Admitting: Podiatry

## 2024-01-08 ENCOUNTER — Other Ambulatory Visit: Payer: Self-pay

## 2024-01-08 ENCOUNTER — Ambulatory Visit (INDEPENDENT_AMBULATORY_CARE_PROVIDER_SITE_OTHER): Payer: Self-pay

## 2024-01-08 ENCOUNTER — Ambulatory Visit (INDEPENDENT_AMBULATORY_CARE_PROVIDER_SITE_OTHER): Payer: Self-pay | Admitting: Podiatry

## 2024-01-08 DIAGNOSIS — M79672 Pain in left foot: Secondary | ICD-10-CM

## 2024-01-08 DIAGNOSIS — G8929 Other chronic pain: Secondary | ICD-10-CM

## 2024-01-08 MED ORDER — DICLOFENAC SODIUM 75 MG PO TBEC
75.0000 mg | DELAYED_RELEASE_TABLET | Freq: Two times a day (BID) | ORAL | 2 refills | Status: AC
  Filled 2024-01-08: qty 50, 25d supply, fill #0
  Filled 2024-02-12: qty 50, 25d supply, fill #1
  Filled 2024-04-24: qty 50, 25d supply, fill #2

## 2024-01-08 NOTE — Patient Instructions (Signed)
 Tendinitis aqulea Achilles Tendinitis  La tendinitis aqulea es una inflamacin de la banda dura, con aspecto de cordn, que [employer] inferiores de la pierna con el hueso del taln (tendn de [place]). Esto suele deberse al uso excesivo del tendn y la articulacin del tobillo. En la [employer], la tendinitis [place] mejora con el tiempo con el tratamiento y el cuidado en casa. La recuperacin completa puede llevar semanas o meses. Cules son las causas? Esta afeccin puede ser causada por lo siguiente: Un aumento repentino del ejercicio fsico o la actividad, como correr. La repeticin de los mismos ejercicios o las mismas actividades, Secondary school teacher. No precalentar los msculos de las pantorrillas antes de hacer ejercicio. Hacer ejercicio con calzado que est desgastado o no es adecuado para este fin. Tener artritis o una formacin sea (espoln) en la parte posterior del taln. Este puede rozar contra el tendn y Metallurgist. Desgaste relacionado con la edad. Los tendones se vuelven menos flexibles con la edad y son ms propensos a lesionarse. Cules son los signos o sntomas? Los sntomas frecuentes de esta afeccin [employer] siguientes: Dolor en el tendn de [place] o la parte posterior de la pierna, justo arriba del taln. El dolor puede empeorar cuando hace ejercicio. Rigidez o dolor en la parte posterior de la pierna. Es posible que sienta esto con ms frecuencia por la maana. Hinchazn de la piel sobre el tendn de [place]. Engrosamiento del tendn. Dificultades para pararse en puntas de pie. Cmo se diagnostica? Esta afeccin se diagnostica en funcin de los sntomas y de un examen fsico. Tambin pueden hacerle estudios, por ejemplo: Radiografas. Resonancia magntica (RM). Ecografa. Cmo se trata? La finalidad del tratamiento es [employer] sntomas y ayudarlo a curar la lesin. Es posible que deba hacer lo siguiente: Disminuir o interrumpir las  actividades que causaron la tendinitis. Es posible que le indiquen que pase a Education officer, environmental ejercicios de bajo impacto, como ciclismo o natacin. Aplicar hielo en la zona de la lesin. Realizar fisioterapia. Esto puede incluir ejercicios de elongacin y estiramiento. Tomar antiinflamatorios no esteroideos (AINE), como el ibuprofeno. Estos ayudan con la hinchazn y Chief Technology Officer. Usar calzado con buen apoyo y con cierta elevacin en la parte del taln, vendajes o una bota para caminar (frula tipo bota). Realizar una ciruga. Esta se puede realizar si sus sntomas no mejoran despus de otros tratamientos. Usar ondas de alta energa para comenzar el proceso de curacin (tratamiento con ondas de choque extracorpreas). Esto es poco frecuente. Aplicarse una inyeccin de medicamentos que ayuden a Paramedic la inflamacin (corticoesteroides). Esto es poco frecuente. Siga estas instrucciones en su casa: Si tiene una frula tipo bota: sela como se lo haya indicado el mdico. Qutesela solamente como se lo haya indicado el mdico. Controle la piel alrededor de la frula tipo bota todos [place]. Informe al mdico acerca de cualquier inquietud. Afloje la frula tipo bota si siente hormigueo en los dedos de los pies, si se le entumecen, o se le enfran y se tornan de Research officer, trade union. Mantenga la frula tipo bota limpia. Si la frula tipo bota no es impermeable: No deje que se moje. Cbralos con un envoltorio hermtico cuando tome un bao de inmersin o una ducha. Control del dolor, la rigidez y la hinchazn  Aplique hielo sobre la zona lesionada si se lo indican. Si tiene una frula tipo bota extrable, qutesela como se lo haya indicado el mdico. Ponga el hielo en una bolsa plstica. Coloque una [employer]  la piel y Copy. Aplique el hielo durante 20 minutos, 2 a 3 veces por da. Si la piel se le pone de color rojo brillante, retire el hielo de inmediato para evitar daos en la piel. El [place] de dao es mayor  si no puede sentir dolor, Airline pilot o fro. Mueva los dedos del pie con frecuencia para reducir la rigidez y la hinchazn. Cuando est sentado o acostado, levante (eleve) el pie por encima del nivel del corazn. Actividad No realice ninguna actividad que le cause dolor. Pregntele al mdico cundo puede volver a conducir si tiene una frula tipo bota en el pie. Si va a fisioterapia, haga los ejercicios como se lo haya indicado el mdico o el fisioterapeuta. Retome sus actividades normales como se lo haya indicado el mdico. Pregntele al mdico qu actividades son seguras para usted. Instrucciones generales Use los medicamentos de venta libre y los recetados solamente como se lo haya indicado el mdico. Si se lo indican, envuelva el pie con una venda elstica u otro tipo de vendaje. Esto puede ayudar a Financial planner excesivo del tendn [place] se recupera. El mdico le mostrar cmo vendarse correctamente el pie. Use calzado con buen apoyo o dispositivos que le eleven levemente el taln solo como se lo haya indicado el mdico. Comunquese con un mdico si: Sus sntomas empeoran. El dolor no mejora con medicamentos. Tiene sntomas nuevos que no puede explicar. Siente calor y tiene hinchazn en el pie. Tiene fiebre. Solicite ayuda de inmediato si: Oye un chasquido repentino en el tendn de [place] y luego siente un dolor intenso. No puede mover el pie o los dedos del pie. No puede apoyar el peso del cuerpo [employer]. El pie o los dedos del pie se adormecen y se ven blancos o azules, incluso despus de aflojar el vendaje o la frula tipo bota. Esta informacin no tiene Theme park manager el consejo del mdico. Asegrese de hacerle al mdico cualquier pregunta que tenga. Document Revised: 01/05/2022 Document Reviewed: 01/05/2022 Elsevier Patient Education  2024 [employer].

## 2024-01-09 LAB — FECAL OCCULT BLOOD, IMMUNOCHEMICAL: Fecal Occult Bld: NEGATIVE

## 2024-01-10 ENCOUNTER — Ambulatory Visit: Payer: Self-pay | Admitting: Internal Medicine

## 2024-01-10 NOTE — Progress Notes (Signed)
 Subjective:   Patient ID: Sharon Richardson, female   DOB: 53 y.o.   MRN: [id number]   HPI Patient presents with a lot of pain in the back of the left heel and presents today with caregiver stating it has been going on for about a year but getting worse.  Patient does not smoke and tries to be active   Review of Systems  All other systems reviewed and are negative.       Objective:  Physical Exam Vitals and nursing note reviewed.  Constitutional:      Appearance: She is well-developed.  Pulmonary:     Effort: Pulmonary effort is normal.  Musculoskeletal:        General: Normal range of motion.  Skin:    General: Skin is warm.  Neurological:     Mental Status: She is alert.     Neurovascular status was found to be intact with the patient found to have normal muscle strength and range of motion.  The left posterior heel is inflamed with swelling around the medial side and painful when pressed with what appears to be spur formation present.  Good digital perfusion well-oriented     Assessment:  Inflammatory Achilles tendinitis insertional left in patient who has interpreter with her with spur formation     Plan:  H&P reviewed at this point I have recommended careful reduction of the inflammation with injection explaining risk of rupture through interpreter.  Patient had sterile prep on the medial side and carefully staying away central lateral I injected 3 mg dexamethasone Kenalog 5 mg Xylocaine advised on reduced activity supportive shoes and reappoint as symptoms indicate  X-rays indicate large posterior spur formation no indication of other pathology

## 2024-02-12 ENCOUNTER — Other Ambulatory Visit: Payer: Self-pay

## 2024-04-24 ENCOUNTER — Other Ambulatory Visit: Payer: Self-pay | Admitting: Internal Medicine

## 2024-04-24 ENCOUNTER — Other Ambulatory Visit: Payer: Self-pay

## 2024-04-24 DIAGNOSIS — I10 Essential (primary) hypertension: Secondary | ICD-10-CM

## 2024-04-24 MED ORDER — AMLODIPINE BESYLATE 5 MG PO TABS
5.0000 mg | ORAL_TABLET | Freq: Every day | ORAL | 0 refills | Status: AC
  Filled 2024-04-24: qty 90, 90d supply, fill #0
